# Patient Record
Sex: Female | Born: 1990 | Race: Black or African American | Hispanic: No | Marital: Single | Smoking: Never smoker
Health system: Southern US, Community
[De-identification: ages and names within clinical notes are randomized; demographics above are authoritative.]

## PROBLEM LIST (undated history)

## (undated) ENCOUNTER — Inpatient Hospital Stay (HOSPITAL_COMMUNITY): Payer: Self-pay

## (undated) DIAGNOSIS — D649 Anemia, unspecified: Secondary | ICD-10-CM

## (undated) DIAGNOSIS — E669 Obesity, unspecified: Secondary | ICD-10-CM

## (undated) DIAGNOSIS — R0602 Shortness of breath: Secondary | ICD-10-CM

## (undated) DIAGNOSIS — L309 Dermatitis, unspecified: Secondary | ICD-10-CM

## (undated) DIAGNOSIS — D696 Thrombocytopenia, unspecified: Secondary | ICD-10-CM

## (undated) DIAGNOSIS — O99119 Other diseases of the blood and blood-forming organs and certain disorders involving the immune mechanism complicating pregnancy, unspecified trimester: Secondary | ICD-10-CM

## (undated) DIAGNOSIS — J302 Other seasonal allergic rhinitis: Secondary | ICD-10-CM

## (undated) HISTORY — DX: Other seasonal allergic rhinitis: J30.2

## (undated) HISTORY — DX: Obesity, unspecified: E66.9

## (undated) HISTORY — DX: Dermatitis, unspecified: L30.9

## (undated) HISTORY — DX: Shortness of breath: R06.02

## (undated) HISTORY — PX: TONSILLECTOMY: SUR1361

## (undated) HISTORY — DX: Anemia, unspecified: D64.9

---

## 2013-05-25 LAB — OB RESULTS CONSOLE RUBELLA ANTIBODY, IGM: Rubella: IMMUNE

## 2013-05-25 LAB — OB RESULTS CONSOLE HIV ANTIBODY (ROUTINE TESTING): HIV: NONREACTIVE

## 2013-05-25 LAB — OB RESULTS CONSOLE HEPATITIS B SURFACE ANTIGEN: Hepatitis B Surface Ag: NEGATIVE

## 2013-05-25 LAB — OB RESULTS CONSOLE ANTIBODY SCREEN: Antibody Screen: NEGATIVE

## 2013-07-06 LAB — OB RESULTS CONSOLE RPR: RPR: NONREACTIVE

## 2013-07-08 ENCOUNTER — Encounter (HOSPITAL_COMMUNITY): Payer: Self-pay | Admitting: *Deleted

## 2013-07-08 ENCOUNTER — Other Ambulatory Visit: Payer: Self-pay | Admitting: Obstetrics and Gynecology

## 2013-07-08 ENCOUNTER — Inpatient Hospital Stay (HOSPITAL_COMMUNITY)
Admission: AD | Admit: 2013-07-08 | Discharge: 2013-07-09 | Disposition: A | Discharge status: Home / Self Care | Payer: BC Managed Care – PPO | Source: Ambulatory Visit | Attending: Obstetrics and Gynecology | Admitting: Obstetrics and Gynecology

## 2013-07-08 DIAGNOSIS — O2390 Unspecified genitourinary tract infection in pregnancy, unspecified trimester: Secondary | ICD-10-CM | POA: Insufficient documentation

## 2013-07-08 DIAGNOSIS — N390 Urinary tract infection, site not specified: Secondary | ICD-10-CM | POA: Insufficient documentation

## 2013-07-08 MED ORDER — GENTAMICIN SULFATE 40 MG/ML IJ SOLN
80.0000 mg | INTRAMUSCULAR | Status: DC
  Filled 2013-07-08: qty 2

## 2013-07-08 MED ORDER — GENTAMICIN SULFATE 40 MG/ML IJ SOLN
80.0000 mg | INTRAMUSCULAR | Status: DC
  Administered 2013-07-08: 80 mg via INTRAMUSCULAR
  Filled 2013-07-08: qty 2

## 2013-07-08 NOTE — MAU Note (Signed)
PT SAYS SHE WAS IN OFFICE ON WED-  HAD - URINE SMELLED BAD ,  DID LABS.  TOLD HER SHE CONTINUES TO GET UTI'S.   DENIES   ANY S/S OF UTI

## 2013-07-09 ENCOUNTER — Inpatient Hospital Stay (HOSPITAL_COMMUNITY)
Admission: AD | Admit: 2013-07-09 | Discharge: 2013-07-09 | Disposition: A | Discharge status: Home / Self Care | Payer: BC Managed Care – PPO | Source: Ambulatory Visit | Attending: Obstetrics and Gynecology | Admitting: Obstetrics and Gynecology

## 2013-07-09 DIAGNOSIS — O2390 Unspecified genitourinary tract infection in pregnancy, unspecified trimester: Secondary | ICD-10-CM | POA: Insufficient documentation

## 2013-07-09 DIAGNOSIS — N390 Urinary tract infection, site not specified: Secondary | ICD-10-CM | POA: Insufficient documentation

## 2013-07-09 MED ORDER — GENTAMICIN SULFATE 40 MG/ML IJ SOLN
80.0000 mg | Freq: Once | INTRAMUSCULAR | Status: AC
  Administered 2013-07-09: 80 mg via INTRAMUSCULAR
  Filled 2013-07-09: qty 2

## 2013-07-09 NOTE — MAU Note (Signed)
DENIES ANY ALLERGIC REACTION.

## 2013-07-09 NOTE — MAU Note (Signed)
Pt states here for antibiotic shot. Got first shot here last night and is supposed to get a total of 3 shots. Was told gets frequent UTIs and that's what she need the antibiotic for. Denies any complaints at this time.

## 2013-07-10 ENCOUNTER — Inpatient Hospital Stay (HOSPITAL_COMMUNITY)
Admission: AD | Admit: 2013-07-10 | Discharge: 2013-07-10 | Disposition: A | Discharge status: Home / Self Care | Payer: BC Managed Care – PPO | Source: Ambulatory Visit | Attending: Obstetrics and Gynecology | Admitting: Obstetrics and Gynecology

## 2013-07-10 DIAGNOSIS — N390 Urinary tract infection, site not specified: Secondary | ICD-10-CM | POA: Insufficient documentation

## 2013-07-10 MED ORDER — GENTAMICIN SULFATE 40 MG/ML IJ SOLN
80.0000 mg | Freq: Once | INTRAMUSCULAR | Status: AC
  Administered 2013-07-10: 80 mg via INTRAMUSCULAR
  Filled 2013-07-10: qty 2

## 2013-08-23 LAB — OB RESULTS CONSOLE GC/CHLAMYDIA
Chlamydia: NEGATIVE
Gonorrhea: NEGATIVE

## 2013-08-23 LAB — OB RESULTS CONSOLE ABO/RH: RH Type: POSITIVE

## 2013-08-23 LAB — OB RESULTS CONSOLE GBS: GBS: POSITIVE

## 2013-09-15 ENCOUNTER — Inpatient Hospital Stay (HOSPITAL_COMMUNITY): Payer: BC Managed Care – PPO | Admitting: Anesthesiology

## 2013-09-15 ENCOUNTER — Encounter (HOSPITAL_COMMUNITY): Payer: Self-pay | Admitting: *Deleted

## 2013-09-15 ENCOUNTER — Inpatient Hospital Stay (HOSPITAL_COMMUNITY)
Admission: AD | Admit: 2013-09-15 | Discharge: 2013-09-19 | DRG: 765 | Disposition: A | Discharge status: Home / Self Care | Payer: BC Managed Care – PPO | Source: Ambulatory Visit | Attending: Obstetrics & Gynecology | Admitting: Obstetrics & Gynecology

## 2013-09-15 ENCOUNTER — Encounter (HOSPITAL_COMMUNITY): Payer: BC Managed Care – PPO | Admitting: Anesthesiology

## 2013-09-15 DIAGNOSIS — D689 Coagulation defect, unspecified: Secondary | ICD-10-CM | POA: Diagnosis present

## 2013-09-15 DIAGNOSIS — O339 Maternal care for disproportion, unspecified: Secondary | ICD-10-CM | POA: Diagnosis present

## 2013-09-15 DIAGNOSIS — D696 Thrombocytopenia, unspecified: Secondary | ICD-10-CM

## 2013-09-15 DIAGNOSIS — D62 Acute posthemorrhagic anemia: Secondary | ICD-10-CM | POA: Diagnosis present

## 2013-09-15 DIAGNOSIS — O99892 Other specified diseases and conditions complicating childbirth: Secondary | ICD-10-CM | POA: Diagnosis present

## 2013-09-15 DIAGNOSIS — O9902 Anemia complicating childbirth: Secondary | ICD-10-CM | POA: Diagnosis present

## 2013-09-15 DIAGNOSIS — Z22330 Carrier of Group B streptococcus: Secondary | ICD-10-CM

## 2013-09-15 DIAGNOSIS — O3660X0 Maternal care for excessive fetal growth, unspecified trimester, not applicable or unspecified: Secondary | ICD-10-CM | POA: Diagnosis present

## 2013-09-15 DIAGNOSIS — O324XX0 Maternal care for high head at term, not applicable or unspecified: Secondary | ICD-10-CM | POA: Diagnosis present

## 2013-09-15 DIAGNOSIS — E669 Obesity, unspecified: Secondary | ICD-10-CM | POA: Diagnosis present

## 2013-09-15 HISTORY — DX: Other diseases of the blood and blood-forming organs and certain disorders involving the immune mechanism complicating pregnancy, unspecified trimester: O99.119

## 2013-09-15 HISTORY — DX: Thrombocytopenia, unspecified: D69.6

## 2013-09-15 LAB — CBC
HCT: 30.5 % — ABNORMAL LOW (ref 36.0–46.0)
Hemoglobin: 9.7 g/dL — ABNORMAL LOW (ref 12.0–15.0)
MCH: 23 pg — ABNORMAL LOW (ref 26.0–34.0)
MCHC: 31.8 g/dL (ref 30.0–36.0)
MCV: 72.4 fL — ABNORMAL LOW (ref 78.0–100.0)
Platelets: 147 10*3/uL — ABNORMAL LOW (ref 150–400)
RBC: 4.21 MIL/uL (ref 3.87–5.11)
RDW: 14.8 % (ref 11.5–15.5)
WBC: 8.5 10*3/uL (ref 4.0–10.5)

## 2013-09-15 LAB — ABO/RH: ABO/RH(D): O POS

## 2013-09-15 LAB — TYPE AND SCREEN
ABO/RH(D): O POS
Antibody Screen: NEGATIVE

## 2013-09-15 LAB — RPR: RPR Ser Ql: NONREACTIVE

## 2013-09-15 MED ORDER — LACTATED RINGERS IV SOLN
500.0000 mL | INTRAVENOUS | Status: DC | PRN
  Administered 2013-09-15: 500 mL via INTRAVENOUS

## 2013-09-15 MED ORDER — OXYTOCIN 40 UNITS IN LACTATED RINGERS INFUSION - SIMPLE MED: 62.5000 mL/h | INTRAVENOUS | Status: DC

## 2013-09-15 MED ORDER — PHENYLEPHRINE 40 MCG/ML (10ML) SYRINGE FOR IV PUSH (FOR BLOOD PRESSURE SUPPORT)
80.0000 ug | PREFILLED_SYRINGE | INTRAVENOUS | Status: DC | PRN
  Filled 2013-09-15: qty 10

## 2013-09-15 MED ORDER — OXYCODONE-ACETAMINOPHEN 5-325 MG PO TABS: 1.0000 | ORAL_TABLET | ORAL | Status: DC | PRN

## 2013-09-15 MED ORDER — LIDOCAINE HCL (PF) 1 % IJ SOLN
INTRAMUSCULAR | Status: DC | PRN
  Administered 2013-09-15 (×4): 4 mL

## 2013-09-15 MED ORDER — PENICILLIN G POTASSIUM 5000000 UNITS IJ SOLR
2.5000 10*6.[IU] | INTRAVENOUS | Status: DC
  Administered 2013-09-15 – 2013-09-16 (×3): 2.5 10*6.[IU] via INTRAVENOUS
  Filled 2013-09-15 (×8): qty 2.5

## 2013-09-15 MED ORDER — LACTATED RINGERS IV SOLN: 500.0000 mL | Freq: Once | INTRAVENOUS | Status: DC

## 2013-09-15 MED ORDER — PENICILLIN G POTASSIUM 5000000 UNITS IJ SOLR
5.0000 10*6.[IU] | Freq: Once | INTRAVENOUS | Status: AC
  Administered 2013-09-15: 5 10*6.[IU] via INTRAVENOUS
  Filled 2013-09-15: qty 5

## 2013-09-15 MED ORDER — EPHEDRINE 5 MG/ML INJ: 10.0000 mg | INTRAVENOUS | Status: DC | PRN

## 2013-09-15 MED ORDER — DIPHENHYDRAMINE HCL 50 MG/ML IJ SOLN: 12.5000 mg | INTRAMUSCULAR | Status: DC | PRN

## 2013-09-15 MED ORDER — FENTANYL 2.5 MCG/ML BUPIVACAINE 1/10 % EPIDURAL INFUSION (WH - ANES)
14.0000 mL/h | INTRAMUSCULAR | Status: DC | PRN
  Administered 2013-09-15 – 2013-09-16 (×2): 14 mL/h via EPIDURAL
  Filled 2013-09-15 (×2): qty 125

## 2013-09-15 MED ORDER — LIDOCAINE HCL (PF) 1 % IJ SOLN: 30.0000 mL | INTRAMUSCULAR | Status: DC | PRN

## 2013-09-15 MED ORDER — PHENYLEPHRINE 40 MCG/ML (10ML) SYRINGE FOR IV PUSH (FOR BLOOD PRESSURE SUPPORT)
80.0000 ug | PREFILLED_SYRINGE | INTRAVENOUS | Status: DC | PRN
  Administered 2013-09-15: 80 ug via INTRAVENOUS
  Filled 2013-09-15: qty 10

## 2013-09-15 MED ORDER — LACTATED RINGERS IV SOLN
INTRAVENOUS | Status: DC
  Administered 2013-09-15 – 2013-09-16 (×3): via INTRAVENOUS

## 2013-09-15 MED ORDER — EPHEDRINE 5 MG/ML INJ
10.0000 mg | INTRAVENOUS | Status: DC | PRN
  Filled 2013-09-15: qty 4

## 2013-09-15 MED ORDER — CITRIC ACID-SODIUM CITRATE 334-500 MG/5ML PO SOLN
30.0000 mL | ORAL | Status: DC | PRN
  Administered 2013-09-16 (×2): 30 mL via ORAL
  Filled 2013-09-15 (×2): qty 15

## 2013-09-15 MED ORDER — IBUPROFEN 600 MG PO TABS: 600.0000 mg | ORAL_TABLET | Freq: Four times a day (QID) | ORAL | Status: DC | PRN

## 2013-09-15 NOTE — Progress Notes (Signed)
Subjective:   Moderate discomfort with ctx. Declines pain meds/epidural. Objective:   VS: Blood pressure 119/76, pulse 91, temperature 98.1 F (36.7 C), temperature source Oral, resp. rate 18, height 5\' 1"  (1.549 m), weight 113.853 kg (251 lb). FHR : baseline 135 / variability moderate / accelerations present / absent decelerations Toco: contractions every 2-5 minutes  Cervix : 5/90/-2 Membranes: bulging  Assessment:  First stage labor, latent FHR category I GBS +  Plan:  First dose of PCN infusing. Ambulate/change positions for discomfort and aide labor progression. Expectant management.     Donette Larry, N MSN, CNM 09/15/2013, 9:29 PM

## 2013-09-15 NOTE — H&P (Signed)
  OB ADMISSION/ HISTORY & PHYSICAL:  Admission Date: 09/15/2013  5:57 PM  Admit Diagnosis: 38.[redacted] wks gestation  Megan Ball is a 22 y.o. female presenting for regular painful ctx, q 5 min over last 2 hrs. Cervical change from exam this am.  Prenatal History: G1P0   EDC : 09/24/2013, by Other Basis  Prenatal care at [facility]  Primary Ob Provider: Donette Larry, CNM Prenatal course complicated by IDA, excessive weight gain, +GBS, elevated AFI (22.1) and near LGA (EFW 8-6 [88%]  on 09/14/13) Prenatal Labs: ABO, Rh: O (10/14 0000) POS Antibody: Negative (07/16 0000) Rubella: Immune (07/16 0000)  RPR: Nonreactive (08/27 0000) HBsAg: Negative (07/16 0000)  HIV: Non-reactive (07/16 0000)  GBS: Positive (10/14 0000)  1 hr GTT : 130  Medical / Surgical History :  Past medical history:  Past Medical History  Diagnosis Date  . Medical history non-contributory   Obesity  Past surgical history:  Past Surgical History  Procedure Laterality Date  . No past surgeries    Tonsillectomy  Family History: History reviewed. No pertinent family history.   Social History:  does not have a smoking history on file. She has never used smokeless tobacco. She reports that she does not drink alcohol or use illicit drugs.  Allergies: Review of patient's allergies indicates no known allergies.   Current Medications at time of admission:  Prior to Admission medications   Medication Sig Start Date End Date Taking? Authorizing Provider  Prenatal Vit-Fe Fumarate-FA (PRENATAL MULTIVITAMIN) TABS tablet Take 1 tablet by mouth daily at 12 noon.   Yes Historical Provider, MD     Review of Systems: +ctx q 5 min +FM No LOF No VB No dizziness, SOB, or chest pain   Physical Exam:  VS: Blood pressure 124/80, pulse 86, temperature 98.5 F (36.9 C), resp. rate 16, height 5\' 1"  (1.549 m), weight 113.853 kg (251 lb).  General: alert and oriented x3, appears mildly uncomfortable  with ctx Heart: RRR Lungs: Clear lung fields Abdomen: Gravid, soft and non-tender, non-distended  Extremities: Trace edema BLE, Homan;s negative  Genitalia / VE: Dilation: 4 Effacement (%): 80 Station: -2 Exam by::  ([name],cnm) (Office exam)  FHR: baseline rate 140 / variability moderate / accelerations present / absent decelerations TOCO: 3-5, moderate  Assessment: 38.[redacted] weeks gestation First stage of labor, latent FHR category I GBS+  Plan:  Admit, GBS prophylaxis, declines pain meds/epidural at this time. Ambulate, encourage mobility use of gravity. Expectant management.  Dr Seymour Bars notified of admission / plan of care   Lawernce Pitts MSN, CNM 09/15/2013, 7:17 PM

## 2013-09-15 NOTE — Anesthesia Procedure Notes (Signed)
Epidural Patient location during procedure: OB Start time: 09/15/2013 10:59 PM  Staffing Performed by: anesthesiologist   Preanesthetic Checklist Completed: patient identified, site marked, surgical consent, pre-op evaluation, timeout performed, IV checked, risks and benefits discussed and monitors and equipment checked  Epidural Patient position: sitting Prep: site prepped and draped and DuraPrep Patient monitoring: continuous pulse ox and blood pressure Approach: midline Injection technique: LOR air  Needle:  Needle type: Tuohy  Needle gauge: 17 G Needle length: 9 cm and 9 Needle insertion depth: 8 cm Catheter type: closed end flexible Catheter size: 19 Gauge Catheter at skin depth: 13 cm Test dose: negative  Assessment Events: blood not aspirated, injection not painful, no injection resistance, negative IV test and no paresthesia  Additional Notes Discussed risk of headache, infection, bleeding, nerve injury and failed or incomplete block.  Patient voices understanding and wishes to proceed.  Epidural placed easily on first attempt.  No paresthesia.  Patient tolerated procedure well with no apparent complications.  Jasmine December, MDReason for block:procedure for pain

## 2013-09-15 NOTE — Anesthesia Preprocedure Evaluation (Signed)
Anesthesia Evaluation  Patient identified by MRN, date of birth, ID band Patient awake    Reviewed: Allergy & Precautions, H&P , NPO status , Patient's Chart, lab work & pertinent test results, reviewed documented beta blocker date and time   History of Anesthesia Complications Negative for: history of anesthetic complications  Airway Mallampati: II TM Distance: >3 FB Neck ROM: full    Dental  (+) Teeth Intact   Pulmonary neg pulmonary ROS,  breath sounds clear to auscultation        Cardiovascular negative cardio ROS  Rhythm:regular Rate:Normal     Neuro/Psych negative neurological ROS  negative psych ROS   GI/Hepatic negative GI ROS, Neg liver ROS,   Endo/Other  Morbid obesity  Renal/GU negative Renal ROS     Musculoskeletal   Abdominal   Peds  Hematology  (+) anemia ,   Anesthesia Other Findings   Reproductive/Obstetrics (+) Pregnancy                           Anesthesia Physical Anesthesia Plan  ASA: III  Anesthesia Plan: Epidural   Post-op Pain Management:    Induction:   Airway Management Planned:   Additional Equipment:   Intra-op Plan:   Post-operative Plan:   Informed Consent: I have reviewed the patients History and Physical, chart, labs and discussed the procedure including the risks, benefits and alternatives for the proposed anesthesia with the patient or authorized representative who has indicated his/her understanding and acceptance.     Plan Discussed with:   Anesthesia Plan Comments:         Anesthesia Quick Evaluation

## 2013-09-16 ENCOUNTER — Encounter (HOSPITAL_COMMUNITY)
Admission: AD | Disposition: A | Discharge status: Home / Self Care | Payer: Self-pay | Source: Ambulatory Visit | Attending: Obstetrics & Gynecology

## 2013-09-16 ENCOUNTER — Encounter (HOSPITAL_COMMUNITY): Payer: Self-pay | Admitting: Neonatology

## 2013-09-16 SURGERY — Surgical Case
Anesthesia: Epidural | Site: Abdomen | Wound class: Clean Contaminated

## 2013-09-16 MED ORDER — SIMETHICONE 80 MG PO CHEW
80.0000 mg | CHEWABLE_TABLET | Freq: Three times a day (TID) | ORAL | Status: DC
  Administered 2013-09-17 – 2013-09-19 (×7): 80 mg via ORAL
  Filled 2013-09-16 (×6): qty 1

## 2013-09-16 MED ORDER — SCOPOLAMINE 1 MG/3DAYS TD PT72
1.0000 | MEDICATED_PATCH | Freq: Once | TRANSDERMAL | Status: AC
  Administered 2013-09-16: 1.5 mg via TRANSDERMAL

## 2013-09-16 MED ORDER — KETOROLAC TROMETHAMINE 60 MG/2ML IM SOLN
INTRAMUSCULAR | Status: AC
  Administered 2013-09-16: 60 mg via INTRAMUSCULAR
  Filled 2013-09-16: qty 2

## 2013-09-16 MED ORDER — LACTATED RINGERS IV SOLN
INTRAVENOUS | Status: DC | PRN
  Administered 2013-09-16 (×2): via INTRAVENOUS

## 2013-09-16 MED ORDER — ONDANSETRON HCL 4 MG/2ML IJ SOLN: 4.0000 mg | Freq: Three times a day (TID) | INTRAMUSCULAR | Status: DC | PRN

## 2013-09-16 MED ORDER — PHENYLEPHRINE 40 MCG/ML (10ML) SYRINGE FOR IV PUSH (FOR BLOOD PRESSURE SUPPORT)
PREFILLED_SYRINGE | INTRAVENOUS | Status: AC
  Filled 2013-09-16: qty 5

## 2013-09-16 MED ORDER — FERROUS SULFATE 325 (65 FE) MG PO TABS
325.0000 mg | ORAL_TABLET | Freq: Two times a day (BID) | ORAL | Status: DC
  Administered 2013-09-17 – 2013-09-19 (×5): 325 mg via ORAL
  Filled 2013-09-16 (×5): qty 1

## 2013-09-16 MED ORDER — ZOLPIDEM TARTRATE 5 MG PO TABS: 5.0000 mg | ORAL_TABLET | Freq: Every evening | ORAL | Status: DC | PRN

## 2013-09-16 MED ORDER — NALOXONE HCL 1 MG/ML IJ SOLN
1.0000 ug/kg/h | INTRAVENOUS | Status: DC | PRN
  Filled 2013-09-16: qty 2

## 2013-09-16 MED ORDER — NALOXONE HCL 0.4 MG/ML IJ SOLN: 0.4000 mg | INTRAMUSCULAR | Status: DC | PRN

## 2013-09-16 MED ORDER — WITCH HAZEL-GLYCERIN EX PADS: 1.0000 "application " | MEDICATED_PAD | CUTANEOUS | Status: DC | PRN

## 2013-09-16 MED ORDER — CEFAZOLIN SODIUM 1-5 GM-% IV SOLN
INTRAVENOUS | Status: DC | PRN
  Administered 2013-09-16: 2 g via INTRAVENOUS

## 2013-09-16 MED ORDER — PHENYLEPHRINE HCL 10 MG/ML IJ SOLN
INTRAMUSCULAR | Status: AC
  Filled 2013-09-16: qty 1

## 2013-09-16 MED ORDER — DIPHENHYDRAMINE HCL 25 MG PO CAPS: 25.0000 mg | ORAL_CAPSULE | ORAL | Status: DC | PRN

## 2013-09-16 MED ORDER — BUPIVACAINE HCL (PF) 0.25 % IJ SOLN
INTRAMUSCULAR | Status: AC
  Filled 2013-09-16: qty 30

## 2013-09-16 MED ORDER — KETOROLAC TROMETHAMINE 30 MG/ML IJ SOLN: 30.0000 mg | Freq: Four times a day (QID) | INTRAMUSCULAR | Status: AC | PRN

## 2013-09-16 MED ORDER — NALBUPHINE HCL 10 MG/ML IJ SOLN
5.0000 mg | INTRAMUSCULAR | Status: DC | PRN
  Filled 2013-09-16: qty 1

## 2013-09-16 MED ORDER — OXYTOCIN 40 UNITS IN LACTATED RINGERS INFUSION - SIMPLE MED
1.0000 m[IU]/min | INTRAVENOUS | Status: DC
  Administered 2013-09-16: 2 m[IU]/min via INTRAVENOUS
  Filled 2013-09-16: qty 1000

## 2013-09-16 MED ORDER — NALBUPHINE HCL 10 MG/ML IJ SOLN
5.0000 mg | INTRAMUSCULAR | Status: DC | PRN
  Administered 2013-09-16: 10 mg via SUBCUTANEOUS
  Filled 2013-09-16 (×2): qty 1

## 2013-09-16 MED ORDER — SENNOSIDES-DOCUSATE SODIUM 8.6-50 MG PO TABS
2.0000 | ORAL_TABLET | ORAL | Status: DC
  Administered 2013-09-17 – 2013-09-19 (×3): 2 via ORAL
  Filled 2013-09-16 (×3): qty 2

## 2013-09-16 MED ORDER — MENTHOL 3 MG MT LOZG: 1.0000 | LOZENGE | OROMUCOSAL | Status: DC | PRN

## 2013-09-16 MED ORDER — MEPERIDINE HCL 25 MG/ML IJ SOLN: 6.2500 mg | INTRAMUSCULAR | Status: DC | PRN

## 2013-09-16 MED ORDER — TETANUS-DIPHTH-ACELL PERTUSSIS 5-2.5-18.5 LF-MCG/0.5 IM SUSP: 0.5000 mL | Freq: Once | INTRAMUSCULAR | Status: DC

## 2013-09-16 MED ORDER — MAGNESIUM HYDROXIDE 400 MG/5ML PO SUSP: 30.0000 mL | ORAL | Status: DC | PRN

## 2013-09-16 MED ORDER — ONDANSETRON HCL 4 MG/2ML IJ SOLN
INTRAMUSCULAR | Status: DC | PRN
  Administered 2013-09-16: 4 mg via INTRAVENOUS

## 2013-09-16 MED ORDER — SODIUM BICARBONATE 8.4 % IV SOLN
INTRAVENOUS | Status: AC
  Filled 2013-09-16: qty 50

## 2013-09-16 MED ORDER — SODIUM BICARBONATE 8.4 % IV SOLN
INTRAVENOUS | Status: DC | PRN
  Administered 2013-09-16 (×3): 5 mL via EPIDURAL

## 2013-09-16 MED ORDER — HYDROMORPHONE HCL PF 1 MG/ML IJ SOLN: 0.2500 mg | INTRAMUSCULAR | Status: DC | PRN

## 2013-09-16 MED ORDER — ONDANSETRON HCL 4 MG/2ML IJ SOLN: 4.0000 mg | Freq: Four times a day (QID) | INTRAMUSCULAR | Status: DC

## 2013-09-16 MED ORDER — BUPIVACAINE HCL (PF) 0.25 % IJ SOLN
INTRAMUSCULAR | Status: DC | PRN
  Administered 2013-09-16: 20 mL

## 2013-09-16 MED ORDER — LIDOCAINE-EPINEPHRINE (PF) 2 %-1:200000 IJ SOLN
INTRAMUSCULAR | Status: AC
  Filled 2013-09-16: qty 20

## 2013-09-16 MED ORDER — SIMETHICONE 80 MG PO CHEW
80.0000 mg | CHEWABLE_TABLET | ORAL | Status: DC
  Administered 2013-09-17 – 2013-09-19 (×2): 80 mg via ORAL
  Filled 2013-09-16 (×3): qty 1

## 2013-09-16 MED ORDER — MORPHINE SULFATE (PF) 0.5 MG/ML IJ SOLN
INTRAMUSCULAR | Status: DC | PRN
  Administered 2013-09-16: 4 mg via EPIDURAL

## 2013-09-16 MED ORDER — LANOLIN HYDROUS EX OINT: 1.0000 "application " | TOPICAL_OINTMENT | CUTANEOUS | Status: DC | PRN

## 2013-09-16 MED ORDER — ONDANSETRON HCL 4 MG PO TABS: 4.0000 mg | ORAL_TABLET | ORAL | Status: DC | PRN

## 2013-09-16 MED ORDER — METOCLOPRAMIDE HCL 5 MG/ML IJ SOLN: 10.0000 mg | Freq: Three times a day (TID) | INTRAMUSCULAR | Status: DC | PRN

## 2013-09-16 MED ORDER — OXYTOCIN 10 UNIT/ML IJ SOLN
40.0000 [IU] | INTRAVENOUS | Status: DC | PRN
  Administered 2013-09-16: 40 [IU] via INTRAVENOUS

## 2013-09-16 MED ORDER — OXYTOCIN 40 UNITS IN LACTATED RINGERS INFUSION - SIMPLE MED: 62.5000 mL/h | INTRAVENOUS | Status: AC

## 2013-09-16 MED ORDER — DIBUCAINE 1 % RE OINT: 1.0000 "application " | TOPICAL_OINTMENT | RECTAL | Status: DC | PRN

## 2013-09-16 MED ORDER — OXYTOCIN 10 UNIT/ML IJ SOLN
INTRAMUSCULAR | Status: AC
  Filled 2013-09-16: qty 4

## 2013-09-16 MED ORDER — SIMETHICONE 80 MG PO CHEW: 80.0000 mg | CHEWABLE_TABLET | ORAL | Status: DC | PRN

## 2013-09-16 MED ORDER — DIPHENHYDRAMINE HCL 50 MG/ML IJ SOLN: 12.5000 mg | INTRAMUSCULAR | Status: DC | PRN

## 2013-09-16 MED ORDER — TERBUTALINE SULFATE 1 MG/ML IJ SOLN: 0.2500 mg | Freq: Once | INTRAMUSCULAR | Status: DC | PRN

## 2013-09-16 MED ORDER — ERYTHROMYCIN 5 MG/GM OP OINT
TOPICAL_OINTMENT | OPHTHALMIC | Status: AC
  Filled 2013-09-16: qty 1

## 2013-09-16 MED ORDER — ONDANSETRON HCL 4 MG/2ML IJ SOLN: 4.0000 mg | INTRAMUSCULAR | Status: DC | PRN

## 2013-09-16 MED ORDER — FENTANYL CITRATE 0.05 MG/ML IJ SOLN
INTRAMUSCULAR | Status: DC | PRN
Start: 2013-09-16 — End: 2013-09-16
  Administered 2013-09-16: 50 ug via INTRATHECAL
  Administered 2013-09-16: 50 ug via INTRAVENOUS

## 2013-09-16 MED ORDER — IBUPROFEN 600 MG PO TABS
600.0000 mg | ORAL_TABLET | Freq: Four times a day (QID) | ORAL | Status: DC
  Administered 2013-09-17 (×2): 600 mg via ORAL
  Filled 2013-09-16 (×2): qty 1

## 2013-09-16 MED ORDER — FENTANYL CITRATE 0.05 MG/ML IJ SOLN
INTRAMUSCULAR | Status: AC
  Filled 2013-09-16: qty 2

## 2013-09-16 MED ORDER — MORPHINE SULFATE 0.5 MG/ML IJ SOLN
INTRAMUSCULAR | Status: AC
  Filled 2013-09-16: qty 10

## 2013-09-16 MED ORDER — SCOPOLAMINE 1 MG/3DAYS TD PT72
MEDICATED_PATCH | TRANSDERMAL | Status: AC
  Administered 2013-09-16: 1.5 mg via TRANSDERMAL
  Filled 2013-09-16: qty 1

## 2013-09-16 MED ORDER — OXYCODONE-ACETAMINOPHEN 5-325 MG PO TABS
1.0000 | ORAL_TABLET | ORAL | Status: DC | PRN
  Administered 2013-09-17 – 2013-09-19 (×8): 1 via ORAL
  Filled 2013-09-16 (×9): qty 1

## 2013-09-16 MED ORDER — LACTATED RINGERS IV SOLN
INTRAVENOUS | Status: DC
  Administered 2013-09-16: 20:00:00 via INTRAVENOUS

## 2013-09-16 MED ORDER — PRENATAL MULTIVITAMIN CH
1.0000 | ORAL_TABLET | Freq: Every day | ORAL | Status: DC
  Administered 2013-09-17 – 2013-09-18 (×2): 1 via ORAL
  Filled 2013-09-16 (×2): qty 1

## 2013-09-16 MED ORDER — DIPHENHYDRAMINE HCL 25 MG PO CAPS
25.0000 mg | ORAL_CAPSULE | Freq: Four times a day (QID) | ORAL | Status: DC | PRN
  Administered 2013-09-17: 25 mg via ORAL
  Filled 2013-09-16: qty 1

## 2013-09-16 MED ORDER — ONDANSETRON HCL 4 MG/2ML IJ SOLN
INTRAMUSCULAR | Status: AC
  Filled 2013-09-16: qty 2

## 2013-09-16 MED ORDER — SODIUM CHLORIDE 0.9 % IJ SOLN: 3.0000 mL | INTRAMUSCULAR | Status: DC | PRN

## 2013-09-16 MED ORDER — KETOROLAC TROMETHAMINE 60 MG/2ML IM SOLN
60.0000 mg | Freq: Once | INTRAMUSCULAR | Status: AC | PRN
  Administered 2013-09-16: 60 mg via INTRAMUSCULAR

## 2013-09-16 MED ORDER — DIPHENHYDRAMINE HCL 50 MG/ML IJ SOLN
25.0000 mg | INTRAMUSCULAR | Status: DC | PRN
  Filled 2013-09-16 (×2): qty 1

## 2013-09-16 SURGICAL SUPPLY — 31 items
CLAMP CORD UMBIL (MISCELLANEOUS) IMPLANT
CLOTH BEACON ORANGE TIMEOUT ST (SAFETY) ×2 IMPLANT
DRAPE LG THREE QUARTER DISP (DRAPES) ×4 IMPLANT
DRSG OPSITE POSTOP 4X10 (GAUZE/BANDAGES/DRESSINGS) ×2 IMPLANT
DURAPREP 26ML APPLICATOR (WOUND CARE) ×2 IMPLANT
ELECT REM PT RETURN 9FT ADLT (ELECTROSURGICAL) ×2
ELECTRODE REM PT RTRN 9FT ADLT (ELECTROSURGICAL) ×1 IMPLANT
EXTRACTOR VACUUM M CUP 4 TUBE (SUCTIONS) IMPLANT
GLOVE BIO SURGEON STRL SZ 6.5 (GLOVE) ×2 IMPLANT
GLOVE BIOGEL PI IND STRL 7.0 (GLOVE) ×1 IMPLANT
GLOVE BIOGEL PI INDICATOR 7.0 (GLOVE) ×1
GOWN PREVENTION PLUS XLARGE (GOWN DISPOSABLE) ×4 IMPLANT
GOWN STRL REIN XL XLG (GOWN DISPOSABLE) ×4 IMPLANT
NEEDLE HYPO 22GX1.5 SAFETY (NEEDLE) ×2 IMPLANT
PACK C SECTION WH (CUSTOM PROCEDURE TRAY) ×2 IMPLANT
PAD OB MATERNITY 4.3X12.25 (PERSONAL CARE ITEMS) ×2 IMPLANT
RETRACTOR WND ALEXIS 25 LRG (MISCELLANEOUS) ×1 IMPLANT
RTRCTR C-SECT PINK 25CM LRG (MISCELLANEOUS) IMPLANT
RTRCTR WOUND ALEXIS 25CM LRG (MISCELLANEOUS) ×2
STAPLER VISISTAT 35W (STAPLE) ×2 IMPLANT
SUT PLAIN 2 0 XLH (SUTURE) ×2 IMPLANT
SUT VIC AB 0 CT1 27 (SUTURE) ×2
SUT VIC AB 0 CT1 27XBRD ANBCTR (SUTURE) ×2 IMPLANT
SUT VIC AB 0 CTX 36 (SUTURE) ×2
SUT VIC AB 0 CTX36XBRD ANBCTRL (SUTURE) ×2 IMPLANT
SUT VIC AB 2-0 CT1 27 (SUTURE) ×1
SUT VIC AB 2-0 CT1 TAPERPNT 27 (SUTURE) ×1 IMPLANT
SYR CONTROL 10ML LL (SYRINGE) ×2 IMPLANT
TOWEL OR 17X24 6PK STRL BLUE (TOWEL DISPOSABLE) ×2 IMPLANT
TRAY FOLEY CATH 14FR (SET/KITS/TRAYS/PACK) ×2 IMPLANT
WATER STERILE IRR 1000ML POUR (IV SOLUTION) IMPLANT

## 2013-09-16 NOTE — Anesthesia Postprocedure Evaluation (Signed)
  Anesthesia Post-op Note  Patient: Megan Ball  Procedure(s) Performed: Procedure(s): CESAREAN SECTION (N/A)  Patient is awake, responsive, moving her legs, and has signs of resolution of her numbness. Pain and nausea are reasonably well controlled. Vital signs are stable and clinically acceptable. Oxygen saturation is clinically acceptable. There are no apparent anesthetic complications at this time. Patient is ready for discharge.

## 2013-09-16 NOTE — Progress Notes (Signed)
Patient ID: Megan Ball, female   DOB: May 01, 1991, 22 y.o.   MRN: [id number] S: Sleeping intermittently, pain well-controlled with an Epidural.     O: Filed Vitals:   09/16/13 0830 09/16/13 0836 09/16/13 0900 09/16/13 0943  BP: 107/48  106/52   Pulse: 88  92   Temp:      TempSrc:      Resp:  21 20 20   Height:      Weight:         FHT:  FHR: 140 bpm, variability: moderate,  accelerations:  Present,  decelerations:  Absent UC:   regular, every 1.5-3 minutes MVUs: 125-170 SVE:   Dilation: 7 Effacement (%): 90 Station: -2/-1 Exam by:: Carloyn Jaeger, CNM   A / P: Protracted active phase Failure to Descend Inadequate MVUs CPD  Fetal Wellbeing:  Category I Pain Control:  Epidural IUPC - MVUs: 125-170 (not on Pitocin)  Anticipated MOD:  Recommend Primary C/S - will consult with Dr. Seymour Bars Discussed how CPD can make it difficult for a vaginal birth and risks of shoulder dystocia  Consulted Dr. Seymour Bars concerning plan of care / recommendation to prep for cesarean delivery - Dr. Seymour Bars will talk with patient and family prior to surgery to discuss rationale and risks/benefits of a cesarean delivery   [name], M, MSN, CNM 09/16/2013, 9:50 AM

## 2013-09-16 NOTE — Progress Notes (Addendum)
Subjective:   Resting, comfortable with epidural.  Objective:   VS: Blood pressure 118/60, pulse 97, temperature 98.1 F (36.7 C), temperature source Oral, resp. rate 18, height 5\' 1"  (1.549 m), weight 113.853 kg (251 lb). FHR : baseline 140 / variability moderate / accelerations present / no decelerations Toco: contractions every 2-5 minutes  Cervix : 5/90/-2/ballotable Membranes: bulging  Assessment:  First stage labor, protracted FHR category I  Plan:  Start Pitocin augmentation. Fetal head ballotable, AROM when more applied to cervix or lower station.     Donette Larry, N MSN, CNM 09/16/2013, 12:13 AM

## 2013-09-16 NOTE — Progress Notes (Signed)
9604 turned patient to Semi Fowlers position. Adjusted Toco and Ultrasound, FHR decelertion began.  0600 Rolled patient to Left side .Pitocin off, O2 started @ 10 liters, scalp electrode placed, scalp stim without recovery.  0604 rolled patient to Right side, 0605 FHR recovery. Difficult to move patient due to obesity and epidural anesthesia, family assisted.

## 2013-09-16 NOTE — Progress Notes (Signed)
Subjective:   Comfortable with epidural.   Objective:   VS: Blood pressure 111/66, pulse 89, temperature 97.9 F (36.6 C), temperature source Oral, resp. rate 18, height 5\' 1"  (1.549 m), weight 113.853 kg (251 lb). FHR : baseline 145 / variability moderate / accelerations present / absent decelerations Toco: contractions every 2-4 minutes  Cervix : 7/100/-2 Membranes: ruptured, clear Notified of prolonged decel @ 0550 x 8 min with return to baseline after resuscitative measures. FSE applied. Pitocin off.  Assessment:  First stage labor, protracted FHR category I  Plan:  Minimal progress, no descent, potential CPD with high-normal EFW. Will place IUPC for labor adequacy and recheck in 2-3 hours. May need consult for CS. Dr. Seymour Bars updated with assessment and plan.     Megan Ball, N MSN, CNM 09/16/2013, 7:08 AM

## 2013-09-16 NOTE — Progress Notes (Signed)
Subjective:   Comfortable with epidural. Nausea resolved.  Objective:   VS: Blood pressure 122/67, pulse 91, temperature 97.9 F (36.6 C), temperature source Oral, resp. rate 18, height 5\' 1"  (1.549 m), weight 113.853 kg (251 lb). FHR : baseline 135 / variability moderate / accelerations present / no decelerations Toco: contractions every 2-4 minutes  Cervix : 7/90/-2; 6/90/-2 (after AROM) Membranes: AROM, clear Pitocin: 85mu/min  Assessment:  First stage labor-protracted FHR category I GBS+  Plan:  Continue Pitocin augmentation, continue GBS prophylaxis. Fetal head applied to cervix now. Slow progress, IUPC if not adequate cervical change with next check. Guarded for SVD d/t fetal size/pelvis.     Donette Larry, N MSN, CNM 09/16/2013, 4:05 AM

## 2013-09-16 NOTE — Transfer of Care (Signed)
Immediate Anesthesia Transfer of Care Note  Patient: Megan Ball  Procedure(s) Performed: Procedure(s): CESAREAN SECTION (N/A)  Patient Location: PACU  Anesthesia Type:Epidural  Level of Consciousness: awake and alert   Airway & Oxygen Therapy: Patient Spontanous Breathing  Post-op Assessment: Report given to PACU RN and Post -op Vital signs reviewed and stable  Post vital signs: Reviewed and stable  Complications: No apparent anesthesia complications

## 2013-09-16 NOTE — Anesthesia Postprocedure Evaluation (Signed)
  Anesthesia Post-op Note  Patient: Megan Ball  Procedure(s) Performed: Procedure(s): CESAREAN SECTION (N/A)  Patient Location: PACU and Mother/Baby  Anesthesia Type:Epidural  Level of Consciousness: awake, alert , oriented and patient cooperative  Airway and Oxygen Therapy: Patient Spontanous Breathing  Post-op Pain: none  Post-op Assessment: Post-op Vital signs reviewed, Patient's Cardiovascular Status Stable and Respiratory Function Stable  Post-op Vital Signs: Reviewed and stable  Complications: No apparent anesthesia complications

## 2013-09-16 NOTE — Op Note (Signed)
Preoperative diagnosis: Intrauterine pregnancy at 38.5 weeks                                            Arrest of dilation and descent  Post operative diagnosis: Same  Anesthesia: Epidural  Anesthesiologist: Dr. Cristela Blue  Procedure: Primary urgent low transverse cesarean section  Surgeon: Dr. Genia Del  Assistant: Denton Meek   Estimated blood loss: 800 cc  Procedure:  After being informed of the planned procedure and possible complications including bleeding, infection, injury to other organs, informed consent is obtained. The patient is taken to OR #9 and the epidural anesthesia level was increased without complication. She is placed in the dorsal decubitus position with the pelvis tilted to the left. She is then prepped and draped in a sterile fashion. A Foley catheter is in her bladder.  After assessing adequate level of anesthesia, we infiltrate the suprapubic area with 20 cc of Marcaine 0.25 and perform a Pfannenstiel incision which is brought down sharply to the fascia. The fascia is entered in a low transverse fashion. Linea alba is dissected. Peritoneum is entered in a midline fashion. An Alexis retractor is easily positioned. Visceral peritoneum is entered in a low transverse fashion allowing [name] to safely retract bladder by developing a bladder flap.  The myometrium is then entered in a low transverse fashion; first with knife and then extended bluntly. Amniotic fluid is clear. We assist the birth of a female  infant in occiput posterior high presentation. Mouth and nose are suctioned. The baby is delivered. The cord is clamped and sectioned. The baby is given to the neonatologist present in the room.  10 cc of blood is drawn from the umbilical vein.The placenta is allowed to deliver spontaneously. It is complete and the cord has 3 vessels. Uterine revision is negative.  We proceed with closure of the myometrium in 2 layers: First with a running locked suture of 0  Vicryl, then with a Lembert suture of 0 Vicryl imbricating the first one. Hemostasis is completed with cauterization on peritoneal edges.  Both paracolic gutters are cleaned. Both tubes and ovaries are assessed and normal. The pelvis is profusely irrigated with warm saline to confirm a satisfactory hemostasis.  Retractors and sponges are removed. Under fascia hemostasis is completed with cauterization.  The peritoneum is closed with a running Vicryl 2-0.  The fascia is then closed with 2 running sutures of 0 Vicryl meeting midline. The wound is irrigated with warm saline and hemostasis is completed with cauterization.  The adipose tissue is reapproximated with a running 2-0 Plain.  The skin is closed with staples.  A dressing is applied.  Instrument and sponge count is complete x2. Estimated blood loss is 800 cc.  The procedure is well tolerated by the patient who is taken to recovery room in a well and stable condition.  A female baby was born at 11:20 am and received an Apgar of 9  at 1 minute and 9 at 5 minutes. Weight was pending.    Specimen: Placenta sent to [facility] MD 11/7/201412:03 PM

## 2013-09-17 ENCOUNTER — Encounter (HOSPITAL_COMMUNITY): Payer: Self-pay | Admitting: Obstetrics and Gynecology

## 2013-09-17 DIAGNOSIS — O99119 Other diseases of the blood and blood-forming organs and certain disorders involving the immune mechanism complicating pregnancy, unspecified trimester: Secondary | ICD-10-CM

## 2013-09-17 DIAGNOSIS — D696 Thrombocytopenia, unspecified: Secondary | ICD-10-CM | POA: Diagnosis present

## 2013-09-17 HISTORY — DX: Thrombocytopenia, unspecified: O99.119

## 2013-09-17 HISTORY — DX: Thrombocytopenia, unspecified: D69.6

## 2013-09-17 LAB — CBC
HCT: 23 % — ABNORMAL LOW (ref 36.0–46.0)
Hemoglobin: 7.5 g/dL — ABNORMAL LOW (ref 12.0–15.0)
MCH: 23.5 pg — ABNORMAL LOW (ref 26.0–34.0)
MCHC: 32.6 g/dL (ref 30.0–36.0)
MCV: 72.1 fL — ABNORMAL LOW (ref 78.0–100.0)
Platelets: 129 10*3/uL — ABNORMAL LOW (ref 150–400)
RBC: 3.19 MIL/uL — ABNORMAL LOW (ref 3.87–5.11)
RDW: 14.5 % (ref 11.5–15.5)
WBC: 12.1 10*3/uL — ABNORMAL HIGH (ref 4.0–10.5)

## 2013-09-17 NOTE — Lactation Note (Signed)
This note was copied from the chart of Boy Brandice Busser. Lactation Consultation Note Follow up requested by patient for assistance setting up her personal DEBP.  Baby latched on left breast.  Mom reports great latch and baby suckled for only 3 minutes.  Mom reports previously hearing swallows,  Baby too sleepy to latch.  Encouraged mom to have baby at breast and use hand pump and shells as needed to help evert nipples.  Not currently using hospital DEBP so I explained set up and cleaning of personal pump.    Patient Name: Boy Izola Teague [id number] Date: 09/17/2013 Reason for consult: Follow-up assessment   Maternal Data    Feeding Feeding Type: Breast Fed Length of feed: 3 min  LATCH Score/Interventions Latch: Grasps breast easily, tongue down, lips flanged, rhythmical sucking. Intervention(s): Breast compression;Breast massage  Audible Swallowing: A few with stimulation  Type of Nipple: Flat Intervention(s): Shells  Comfort (Breast/Nipple): Soft / non-tender     Hold (Positioning): No assistance needed to correctly position infant at breast.  LATCH Score: 8  Lactation Tools Discussed/Used     Consult Status Consult Status: Follow-up Date: 09/17/13 Follow-up type: In-patient    Jannifer Rodney 09/17/2013, 6:01 PM

## 2013-09-17 NOTE — Progress Notes (Signed)
Patient ID: Megan Ball, female   DOB: [birth year], 22 y.o.   MRN: [id number] Subjective: POD# 1 Information for the patient's newborn:  Megan, Ball [[id number]]  female  / circ planning outpatient  Reports feeling sore, but well Feeding: breast - well Patient reports tolerating PO.  Breast symptoms: flat nipples, using nipple shields with great success Pain controlled with narcotic analgesics including Percocet Denies HA/SOB/C/P/N/V/dizziness. Flatus absent. She reports vaginal bleeding as normal, without clots.  She is ambulating, urinating without difficult.     Objective:   VS:  Filed Vitals:   09/17/13 0011 09/17/13 0012 09/17/13 0440 09/17/13 0900  BP: 118/69 132/78 112/62 108/69  Pulse: 107 133 84 74  Temp:   97.5 F (36.4 C) 98 F (36.7 C)  TempSrc:   Oral Oral  Resp: 18 20 20 18   Height:      Weight:      SpO2: 98%  98% 97%     Intake/Output Summary (Last 24 hours) at 09/17/13 1144 Last data filed at 09/17/13 0404  Gross per 24 hour  Intake 1050.42 ml  Output   2050 ml  Net -999.58 ml        Recent Labs  09/15/13 1839 09/17/13 0640  WBC 8.5 12.1*  HGB 9.7* 7.5*  HCT 30.5* 23.0*  PLT 147* 129*     Blood type: --/--/O POS, O POS (11/06 1839)  Rubella: Immune (07/16 0000)     Physical Exam:  General: alert, cooperative, no distress and moderately obese CV: Regular rate and rhythm, S1S2 present or without murmur or extra heart sounds Resp: clear Abdomen: soft, nontender, normal bowel sounds Incision: Tegaderm and Honeycomb intact - skin approximated with staples  Uterine Fundus: firm, @ umbilicus, nontender Lochia: minimal Ext: edema 2+ and Homans sign is negative, no sign of DVT      Assessment/Plan: 22 y.o.   POD# 1.  s/p Cesarean Delivery.  Indications: failure to descend                Principal Problem:   Postpartum care following cesarean delivery (11/7) Active Problems:   Gestational thrombocytopenia IDA with compounding ABL  anemia  Doing well, stable.               Iron-rich regular diet as tolerated Ambulate Routine post-op care  Kenard Gower, MSN, CNM 09/17/2013, 11:44 AM

## 2013-09-19 ENCOUNTER — Encounter (HOSPITAL_COMMUNITY): Payer: Self-pay | Admitting: Obstetrics & Gynecology

## 2013-09-19 MED ORDER — SENNOSIDES-DOCUSATE SODIUM 8.6-50 MG PO TABS: 2.0000 | ORAL_TABLET | Freq: Two times a day (BID) | ORAL | Status: DC | PRN

## 2013-09-19 MED ORDER — FERROUS SULFATE 325 (65 FE) MG PO TABS: 325.0000 mg | ORAL_TABLET | Freq: Two times a day (BID) | ORAL | Status: DC

## 2013-09-19 MED ORDER — OXYCODONE-ACETAMINOPHEN 5-325 MG PO TABS: 1.0000 | ORAL_TABLET | ORAL | Status: DC | PRN

## 2013-09-19 NOTE — Progress Notes (Signed)
Patient ID: Megan Ball, female   DOB: 07/12/1991, 22 y.o.   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 Uterine Fundus: firm, below umbilicus, nontender Lochia: minimal Ext: edema +1 pedal and Homans sign is negative, no sign of DVT    A/P: POD # 3/ G2P1001/ S/P Primary C/Section d/t fetal distress Gestational thrombocytopenia; no ibuprofen Chronic anemia, worsened by ABL anemia Doing well and stable for discharge home Will remove staples and tegaderma prior to d/c home RX's: Percocet 5/325 1 - 2 tabs po every 6 hrs prn pain  #30 No refill Niferex 150mg  po QD/BID #30/#60 Refill x 1 Colace 100mg  po up to 3 x daily prn Rt pp visit in 6 weeks   Signed: Demetrius Revel, MSN, [facility] 09/19/2013, 9:01 AM

## 2013-09-21 NOTE — Discharge Summary (Signed)
Obstetric Discharge Summary Reason for Admission: G1 P0 @ 38w 5d in active labor; GBS +  Prenatal Procedures: NST and ultrasound Intrapartum Procedures: cesarean: low cervical, transverse Postpartum Procedures: none Complications-Operative and Postpartum: none Hemoglobin  Date Value Range Status  09/17/2013 7.5* 12.0 - 15.0 g/dL Final     DELTA CHECK NOTED     REPEATED TO VERIFY     HCT  Date Value Range Status  09/17/2013 23.0* 36.0 - 46.0 % Final    Physical Exam:  General: alert, cooperative and no distress Lochia: appropriate Uterine Fundus: firm Incision: closed with staples, well approximated.  Staples removed at d/c and benzoin/steri strips applied DVT Evaluation: No evidence of DVT seen on physical exam.  Discharge Diagnoses: G1 P1 s/p primary C/S for arrest of descent     Gest thrombocytopenia, but markedly improved at d/c to home; PLT 129  Discharge Information: Date: 09/21/2013 Activity: pelvic rest Diet: routine Medications: PNV, Colace, Iron and Percocet Condition: stable Instructions: refer to practice specific booklet Discharge to: home                   Follow up with BHAMBRI, MELANIE, N, CNM.   Specialty:  Obstetrics and Gynecology   Contact information:   [address] [place] [address] [phone]       Newborn Data: Live born female on 09/16/13 Birth Weight: 8 lb 14 oz (4026 g) APGAR: 9, 9  Home with mother. 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  Demetrius Revel, MSN, [facility] 09/19/13 12:00

## 2013-10-13 ENCOUNTER — Ambulatory Visit: Payer: BC Managed Care – PPO | Admitting: Family Medicine

## 2013-10-17 ENCOUNTER — Ambulatory Visit: Payer: BC Managed Care – PPO | Admitting: Family Medicine

## 2013-12-02 ENCOUNTER — Ambulatory Visit (INDEPENDENT_AMBULATORY_CARE_PROVIDER_SITE_OTHER): Payer: BC Managed Care – PPO | Admitting: Family Medicine

## 2013-12-02 ENCOUNTER — Encounter: Payer: Self-pay | Admitting: Family Medicine

## 2013-12-02 VITALS — BP 109/73 | HR 72 | Resp 16 | Ht 60.5 in | Wt 222.0 lb

## 2013-12-02 DIAGNOSIS — L259 Unspecified contact dermatitis, unspecified cause: Secondary | ICD-10-CM

## 2013-12-02 DIAGNOSIS — M549 Dorsalgia, unspecified: Secondary | ICD-10-CM

## 2013-12-02 DIAGNOSIS — L309 Dermatitis, unspecified: Secondary | ICD-10-CM

## 2013-12-02 DIAGNOSIS — D649 Anemia, unspecified: Secondary | ICD-10-CM

## 2013-12-02 LAB — CBC WITH DIFFERENTIAL/PLATELET
Basophils Absolute: 0 10*3/uL (ref 0.0–0.1)
Basophils Relative: 0 % (ref 0–1)
Eosinophils Absolute: 0.2 10*3/uL (ref 0.0–0.7)
Eosinophils Relative: 3 % (ref 0–5)
HCT: 33.9 % — ABNORMAL LOW (ref 36.0–46.0)
Hemoglobin: 10.2 g/dL — ABNORMAL LOW (ref 12.0–15.0)
Lymphocytes Relative: 34 % (ref 12–46)
Lymphs Abs: 2.2 10*3/uL (ref 0.7–4.0)
MCH: 22.1 pg — ABNORMAL LOW (ref 26.0–34.0)
MCHC: 30.1 g/dL (ref 30.0–36.0)
MCV: 73.5 fL — ABNORMAL LOW (ref 78.0–100.0)
Monocytes Absolute: 0.5 10*3/uL (ref 0.1–1.0)
Monocytes Relative: 8 % (ref 3–12)
Neutro Abs: 3.5 10*3/uL (ref 1.7–7.7)
Neutrophils Relative %: 55 % (ref 43–77)
Platelets: 288 10*3/uL (ref 150–400)
RBC: 4.61 MIL/uL (ref 3.87–5.11)
RDW: 15 % (ref 11.5–15.5)
WBC: 6.3 10*3/uL (ref 4.0–10.5)

## 2013-12-02 MED ORDER — BETAMETHASONE DIPROPIONATE 0.05 % EX OINT: TOPICAL_OINTMENT | Freq: Two times a day (BID) | CUTANEOUS | Status: AC

## 2013-12-02 NOTE — Patient Instructions (Signed)
1)  Back Pain - Continue with your diet and exercise to help with weight loss.  Try using My Fitness Pal to help you keep track of your calories in and how much you burn with exercise.  You may want to try a chiropractic adjustment.    2)  Eczema - Apply the steroid ointment as needed.    3)  Breast Reduction - Schedule an appointment with the plastic surgeon.     Back Injury Prevention The following tips can help you to prevent a back injury. PHYSICAL FITNESS  Exercise often. Try to develop strong stomach (abdominal) muscles.  Do aerobic exercises often. This includes walking, jogging, biking, swimming.  Do exercises that help with balance and strength often. This includes tai chi and yoga.  Stretch before and after you exercise.  Keep a healthy weight. DIET   Ask your doctor how much calcium and vitamin D you need every day.  Include calcium in your diet. Foods high in calcium include dairy products; green, leafy vegetables; and products with calcium added (fortified).  Include vitamin D in your diet. Foods high in vitamin D include milk and products with vitamin D added.  Think about taking a multivitamin or other nutritional products called " supplements."  Stop smoking if you smoke. POSTURE   Sit and stand up straight. Avoid leaning forward or hunching over.  Choose chairs that support your lower back.  If you work at a desk:  Sit close to your work so you do not lean over.  Keep your chin tucked in.  Keep your neck drawn back.  Keep your elbows bent at a right angle. Your arms should look like the letter "L."  Sit high and close to the steering wheel when you drive. Add low back support to your car seat if needed.  Avoid sitting or standing in one position for too long. Get up and move around every hour. Take breaks if you are driving for a long time.  Sleep on your side with your knees slightly bent. You can also sleep on your back with a pillow under your  knees. Do not sleep on your stomach. LIFTING, TWISTING, AND REACHING  Avoid heavy lifting, especially lifting over and over again. If you must do heavy lifting:  Stretch before lifting.  Work slowly.  Rest between lifts.  Use carts and dollies to move objects when possible.  Make several small trips instead of carrying 1 heavy load.  Ask for help when you need it.  Ask for help when moving big, awkward objects.  Follow these steps when lifting:  Stand with your feet shoulder-width apart.  Get as close to the object as you can. Do not pick up heavy objects that are far from your body.  Use handles or lifting straps when possible.  Bend at your knees. Squat down, but keep your heels off the floor.  Keep your shoulders back, your chin tucked in, and your back straight.  Lift the object slowly. Tighten the muscles in your legs, stomach, and butt. Keep the object as close to the center of your body as possible.  Reverse these directions when you put a load down.  Do not:  Lift the object above your waist.  Twist at the waist while lifting or carrying a load. Move your feet if you need to turn, not your waist.  Bend over without bending at your knees.  Avoid reaching over your head, across a table, or for an object  on a high surface. OTHER TIPS  Avoid wet floors and keep sidewalks clear of ice.  Do not sleep on a mattress that is too soft or too hard.  Keep items that you use often within easy reach.  Put heavier objects on shelves at waist level. Put lighter objects on lower or higher shelves.  Find ways to lessen your stress. You can try exercise, massage, or relaxation.  Get help for depression or anxiety if needed. GET HELP IF:  You injure your back.  You have questions about diet, exercise, or other ways to prevent back injuries. MAKE SURE YOU:  Understand these instructions.  Will watch your condition.  Will get help right away if you are not doing  well or get worse. Document Released: 04/14/2008 Document Revised: 01/19/2012 Document Reviewed: 12/08/2011 [facility] Patient Information 2014 [place].

## 2013-12-02 NOTE — Progress Notes (Signed)
Subjective:    HPI  Megan Ball is here today to discuss a couple of issues:     1)  Back/Neck Pain:  She says that she has struggled with this problem for years and it seems to be worse since she had her son.  She has upper back/neck pain that she feels is due to her large breasts and she has low back pain that she feels is from carrying her son.  She takes Ibuprofen or Tylenol for her pain.  She feels that she needs breast reduction surgery.  She is currently wearing a size 42 DDD bra but says that this may not even be her correct size.  She was recently measured by a lactation consultant who told her that she is actually a 4242 G or H.    2)  Eczema:   She continues to have dry patches of skin consistent with eczema.  She has used betamethasone cream in the past which has helped her dry patches and she would like a refill.     3)  Anemia:  She was anemic after she had her son.  She is still taking iron.     Review of Systems  Musculoskeletal: Positive for back pain.       Upper back  Skin: Positive for rash.       elbows and lower legs     Past Medical History  Diagnosis Date  . Postpartum care following cesarean delivery (11/7) 09/16/2013  . Gestational thrombocytopenia 09/17/2013  . Obesity      Past Surgical History  Procedure Laterality Date  . Cesarean section N/A 09/16/2013    Procedure: CESAREAN SECTION;  Surgeon: Genia DelMarie-Lyne Lavoie, MD;  Location: [facility];  Service: Obstetrics;  Laterality: N/A;     History   Social History Narrative   Marital Status: Single   Children:  Son Biomedical scientist(Jasiah)     Pets:  None    Living Situation: Lives with mother, sister, son.   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  She is taking this semester off.  She is studying business.     Tobacco Use/Exposure:  None    Alcohol Use:  None    Drug Use:  None   Diet:  Regular   Exercise:  Sit Ups   Hobbies: Shopping                  Family History  Problem Relation Age of Onset  . Obesity Mother   . Hypertension Maternal Grandmother   . Cancer Maternal Grandfather     Throat Cancer     Current Outpatient Prescriptions on File Prior to Visit  Medication Sig Dispense Refill  . Prenatal Vit-Fe Fumarate-FA (PRENATAL MULTIVITAMIN) TABS tablet Take 1 tablet by mouth daily at 12 noon.      . ferrous sulfate 325 (65 FE) MG tablet Take 1 tablet (325 mg total) by mouth 2 (two) times daily with a meal.  60 tablet  2   No current facility-administered medications on file prior to visit.     No Known Allergies   Immunization History  Administered Date(s) Administered  . Influenza Split 08/02/2013  . Tdap 07/06/2013       Objective:   Physical Exam  Vitals reviewed. Constitutional: She is oriented to person, place, and time.  Eyes: Conjunctivae are normal. No scleral icterus.  Neck: Neck supple. No thyromegaly present.  Cardiovascular: Normal rate,  regular rhythm and normal heart sounds.   Pulmonary/Chest: Effort normal and breath sounds normal.  Musculoskeletal: She exhibits tenderness (Back Pain ). She exhibits no edema.       Arms: Lymphadenopathy:    She has no cervical adenopathy.  Neurological: She is alert and oriented to person, place, and time.  Skin: Skin is warm and dry.  Psychiatric: She has a normal mood and affect. Her behavior is normal. Judgment and thought content normal.       Assessment & Plan:    Sarrah was seen today for eczema and back pain.  Diagnoses and associated orders for this visit:  Eczema - betamethasone dipropionate (DIPROLENE) 0.05 % ointment; Apply topically 2 (two) times daily.  Anemia Comments: We're checking a CBC to see that her hemoglobin has come up along with her platelets.   - CBC w/Diff  Backache Comments: She was given contact information for Dr. Chales Abrahams Contogiannis on [address] in [place] to discuss breast reduction surgery.    Mashawn  wants to breastfeed her son until he is one so she is not interested in surgery until next year.  She might consider seeing a chiropractor for an adjustment. She was given a handout with tips to avoid back injury.       TIME SPENT "FACE TO FACE" WITH PATIENT -  30 MINS

## 2013-12-04 DIAGNOSIS — D649 Anemia, unspecified: Secondary | ICD-10-CM | POA: Insufficient documentation

## 2013-12-04 DIAGNOSIS — L309 Dermatitis, unspecified: Secondary | ICD-10-CM | POA: Insufficient documentation

## 2013-12-04 DIAGNOSIS — M549 Dorsalgia, unspecified: Secondary | ICD-10-CM | POA: Insufficient documentation

## 2013-12-05 ENCOUNTER — Telehealth: Payer: Self-pay | Admitting: *Deleted

## 2013-12-05 NOTE — Telephone Encounter (Signed)
Message copied by Ancil BoozerGIL, Travaughn Vue D on Mon Dec 05, 2013  1:30 PM ------      Message from: Birdena JubileeZANARD, ROBYN      Created: Sun Dec 04, 2013  7:49 AM       Please let her know that her hemoglobin is almost back to normal and her platelets are back to normal.  She probably will want to stay on at least one iron pill per day.   ------

## 2013-12-05 NOTE — Telephone Encounter (Signed)
She is aware of her results and will continue taking her iron. PG

## 2014-09-11 ENCOUNTER — Encounter: Payer: Self-pay | Admitting: Family Medicine

## 2016-02-21 DIAGNOSIS — L309 Dermatitis, unspecified: Secondary | ICD-10-CM | POA: Diagnosis not present

## 2016-02-21 DIAGNOSIS — R5383 Other fatigue: Secondary | ICD-10-CM | POA: Diagnosis not present

## 2016-02-21 DIAGNOSIS — E559 Vitamin D deficiency, unspecified: Secondary | ICD-10-CM | POA: Diagnosis not present

## 2016-02-21 DIAGNOSIS — J309 Allergic rhinitis, unspecified: Secondary | ICD-10-CM | POA: Diagnosis not present

## 2016-03-13 DIAGNOSIS — E559 Vitamin D deficiency, unspecified: Secondary | ICD-10-CM | POA: Diagnosis not present

## 2016-03-13 DIAGNOSIS — R5383 Other fatigue: Secondary | ICD-10-CM | POA: Diagnosis not present

## 2016-03-13 DIAGNOSIS — E785 Hyperlipidemia, unspecified: Secondary | ICD-10-CM | POA: Diagnosis not present

## 2016-03-20 DIAGNOSIS — Z0000 Encounter for general adult medical examination without abnormal findings: Secondary | ICD-10-CM | POA: Diagnosis not present

## 2016-03-20 DIAGNOSIS — E559 Vitamin D deficiency, unspecified: Secondary | ICD-10-CM | POA: Diagnosis not present

## 2016-03-20 DIAGNOSIS — D509 Iron deficiency anemia, unspecified: Secondary | ICD-10-CM | POA: Diagnosis not present

## 2016-03-20 DIAGNOSIS — Z6841 Body Mass Index (BMI) 40.0 and over, adult: Secondary | ICD-10-CM | POA: Diagnosis not present

## 2016-05-21 DIAGNOSIS — Z30431 Encounter for routine checking of intrauterine contraceptive device: Secondary | ICD-10-CM | POA: Diagnosis not present

## 2016-05-21 DIAGNOSIS — R109 Unspecified abdominal pain: Secondary | ICD-10-CM | POA: Diagnosis not present

## 2016-05-21 DIAGNOSIS — R1084 Generalized abdominal pain: Secondary | ICD-10-CM | POA: Diagnosis not present

## 2016-07-24 DIAGNOSIS — R5383 Other fatigue: Secondary | ICD-10-CM | POA: Diagnosis not present

## 2016-08-08 DIAGNOSIS — Z6841 Body Mass Index (BMI) 40.0 and over, adult: Secondary | ICD-10-CM | POA: Diagnosis not present

## 2016-08-08 DIAGNOSIS — Z23 Encounter for immunization: Secondary | ICD-10-CM | POA: Diagnosis not present

## 2016-08-08 DIAGNOSIS — E559 Vitamin D deficiency, unspecified: Secondary | ICD-10-CM | POA: Diagnosis not present

## 2016-08-08 DIAGNOSIS — D509 Iron deficiency anemia, unspecified: Secondary | ICD-10-CM | POA: Diagnosis not present

## 2016-12-08 DIAGNOSIS — N760 Acute vaginitis: Secondary | ICD-10-CM | POA: Diagnosis not present

## 2016-12-08 DIAGNOSIS — Z113 Encounter for screening for infections with a predominantly sexual mode of transmission: Secondary | ICD-10-CM | POA: Diagnosis not present

## 2016-12-26 DIAGNOSIS — N760 Acute vaginitis: Secondary | ICD-10-CM | POA: Diagnosis not present

## 2016-12-26 DIAGNOSIS — R39198 Other difficulties with micturition: Secondary | ICD-10-CM | POA: Diagnosis not present

## 2017-02-06 ENCOUNTER — Encounter (HOSPITAL_BASED_OUTPATIENT_CLINIC_OR_DEPARTMENT_OTHER): Payer: Self-pay

## 2017-02-06 ENCOUNTER — Emergency Department (HOSPITAL_BASED_OUTPATIENT_CLINIC_OR_DEPARTMENT_OTHER)
Admission: EM | Admit: 2017-02-06 | Discharge: 2017-02-07 | Disposition: A | Discharge status: Home / Self Care | Payer: 59 | Attending: Emergency Medicine | Admitting: Emergency Medicine

## 2017-02-06 DIAGNOSIS — R112 Nausea with vomiting, unspecified: Secondary | ICD-10-CM | POA: Diagnosis not present

## 2017-02-06 DIAGNOSIS — R509 Fever, unspecified: Secondary | ICD-10-CM | POA: Diagnosis not present

## 2017-02-06 DIAGNOSIS — R51 Headache: Secondary | ICD-10-CM | POA: Diagnosis present

## 2017-02-06 LAB — PREGNANCY, URINE: Preg Test, Ur: NEGATIVE

## 2017-02-06 MED ORDER — ACETAMINOPHEN 325 MG PO TABS
650.0000 mg | ORAL_TABLET | Freq: Once | ORAL | Status: AC
  Administered 2017-02-06: 650 mg via ORAL
  Filled 2017-02-06: qty 2

## 2017-02-06 MED ORDER — IBUPROFEN 400 MG PO TABS
600.0000 mg | ORAL_TABLET | Freq: Once | ORAL | Status: AC
  Administered 2017-02-06: 600 mg via ORAL
  Filled 2017-02-06: qty 1

## 2017-02-06 MED ORDER — ONDANSETRON 4 MG PO TBDP
4.0000 mg | ORAL_TABLET | Freq: Once | ORAL | Status: AC
  Administered 2017-02-06: 4 mg via ORAL
  Filled 2017-02-06: qty 1

## 2017-02-06 NOTE — ED Provider Notes (Signed)
[facility] Provider Note   CSN: [id number] Arrival date & time: 02/06/17  2004   By signing my name below, I, Soijett Blue, attest that this documentation has been prepared under the direction and in the presence of Carmon Sails, PA-C Electronically Signed: Soijett Blue, ED Scribe. 02/06/17. 11:24 PM.  History   Chief Complaint Chief Complaint  Patient presents with  . Headache    HPI Megan Ball is a 26 y.o. female who presents to the Emergency Department complaining of headache, fever, nausea, vomiting, and intermittent abdominal pain right before vomiting. Pt has not tried any medications for the relief of her symptoms. She notes that she did obtain her flu vaccination this past year. Denies sick contacts with similar symptoms. Denies exposure to suspicious food.  No recent travel. No recent new medications. Denies nasal congestion, rhinorrhea, sore throat, diarrhea, body aches, appetite change, rash, dysuria, frequency, flank pain, and any other symptoms. Denies PMHx of asthma, DM, or seasonal allergies.    The history is provided by the patient and a relative. No language interpreter was used.    Past Medical History:  Diagnosis Date  . Gestational thrombocytopenia ([place]) 09/17/2013  . Obesity   . Postpartum care following cesarean delivery (11/7) 09/16/2013    Patient Active Problem List   Diagnosis Date Noted  . Backache 12/04/2013  . Anemia 12/04/2013  . Eczema 12/04/2013  . Gestational thrombocytopenia ([place]) 09/17/2013  . Postpartum care following cesarean delivery (11/7) 09/16/2013    Past Surgical History:  Procedure Laterality Date  . CESAREAN SECTION N/A 09/16/2013   Procedure: CESAREAN SECTION;  Surgeon: Princess Bruins, MD;  Location: [facility];  Service: Obstetrics;  Laterality: N/A;    OB History    Gravida Para Term Preterm AB Living   '2 1 1     1   ' SAB TAB Ectopic Multiple Live Births           1       Home Medications    Prior  to Admission medications   Medication Sig Start Date End Date Taking? Authorizing Provider  ondansetron (ZOFRAN ODT) 4 MG disintegrating tablet Take 1 tablet (4 mg total) by mouth every 8 (eight) hours as needed for nausea or vomiting. 02/07/17   Kinnie Feil, PA-C    Family History Family History  Problem Relation Age of Onset  . Obesity Mother   . Hypertension Maternal Grandmother   . Cancer Maternal Grandfather     Throat Cancer    Social History Social History  Substance Use Topics  . Smoking status: Never Smoker  . Smokeless tobacco: Never Used  . Alcohol use Yes     Comment: occ     Allergies   Patient has no known allergies.   Review of Systems Review of Systems  Constitutional: Positive for fever. Negative for appetite change.  HENT: Negative for congestion, rhinorrhea and sore throat.   Gastrointestinal: Positive for abdominal pain, nausea and vomiting. Negative for diarrhea.  Genitourinary: Negative for dysuria, flank pain and frequency.  Musculoskeletal: Negative for myalgias.  Skin: Negative for rash.  Neurological: Positive for headaches.     Physical Exam Updated Vital Signs BP 121/87 (BP Location: Right Arm)   Pulse 100   Temp 99 F (37.2 C) (Oral)   Resp 20   Ht 5' (1.524 m)   Wt 99.8 kg   SpO2 100%   BMI 42.97 kg/m   Physical Exam  Constitutional: She is oriented to person, place,  and time. She appears well-developed and well-nourished. No distress.  Pt felt warm to the touch.  HENT:  Head: Normocephalic and atraumatic.  Right Ear: External ear normal.  Left Ear: External ear normal.  Nose: Mucosal edema present.  Mouth/Throat: Oropharynx is clear and moist. No oropharyngeal exudate.  Mild mucosal edema with clear rhinorrhea bilaterally  Eyes: Conjunctivae and EOM are normal. Pupils are equal, round, and reactive to light. No scleral icterus.  Neck: Normal range of motion. Neck supple. No JVD present.  Negative  Brudzinski's Negative Kernig's  Cardiovascular: Normal rate, regular rhythm and normal heart sounds.   No murmur heard. Pulmonary/Chest: Effort normal and breath sounds normal. She has no wheezes.  Abdominal: Soft.  No surgical abdominal scars noted.  No pulsating masses.  + Bowel sounds throughout.  Abdomen is soft, non tender without distention, rigidity, guarding or rebound.  No suprapubic tenderness. No CVAT.  Negative Murphy's. Negative McBurney's. Negative Psoas sign.  Non palpable kidneys. No hepatosplenomegaly.   Musculoskeletal: Normal range of motion. She exhibits no deformity.  Lymphadenopathy:    She has no cervical adenopathy.  Neurological: She is alert and oriented to person, place, and time.  Pt is alert and oriented.   Speech and phonation normal.   Thought process coherent.   Strength 5/5 in upper and lower extremities.   Sensation to light touch intact in upper and lower extremities.  Gait normal.   Negative Romberg. No leg drift.  Intact finger to nose test. CN I not tested CN II full visual fields  CN III, IV, VI PEERL and EOM intact CN V light touch intact in all 3 divisions of trigeminal nerve CN VII facial nerve movements intact, symmetric CN VIII hearing intact to finger rub CN IX, X no uvula deviation, symmetric soft palate rise CN XI 5/5 SCM and trapezius strength  CN XII Tongue midline with symmetric L/R movement  Skin: Skin is warm and dry. Capillary refill takes less than 2 seconds.  Psychiatric: She has a normal mood and affect. Her behavior is normal. Judgment and thought content normal.  Nursing note and vitals reviewed.    ED Treatments / Results  DIAGNOSTIC STUDIES: Oxygen Saturation is 100% on RA, nl by my interpretation.    COORDINATION OF CARE: 11:23 PM Discussed treatment plan with pt at bedside which includes labs, UA, tylenol, zofran, and pt agreed to plan.   Labs (all labs ordered are listed, but only abnormal results are  displayed) Labs Reviewed  CBC - Abnormal; Notable for the following:       Result Value   MCV 73.1 (*)    MCH 24.3 (*)    All other components within normal limits  URINALYSIS, ROUTINE W REFLEX MICROSCOPIC - Abnormal; Notable for the following:    Ketones, ur >80 (*)    Protein, ur 30 (*)    Leukocytes, UA SMALL (*)    All other components within normal limits  URINALYSIS, MICROSCOPIC (REFLEX) - Abnormal; Notable for the following:    Bacteria, UA RARE (*)    Squamous Epithelial / LPF 0-5 (*)    All other components within normal limits  LIPASE, BLOOD  COMPREHENSIVE METABOLIC PANEL  PREGNANCY, URINE    EKG  EKG Interpretation None       Radiology No results found.  Procedures Procedures (including critical care time)  Medications Ordered in ED Medications  acetaminophen (TYLENOL) tablet 650 mg (650 mg Oral Given 02/06/17 2015)  ibuprofen (ADVIL,MOTRIN) tablet 600 mg (600  mg Oral Given 02/06/17 2346)  ondansetron (ZOFRAN-ODT) disintegrating tablet 4 mg (4 mg Oral Given 02/06/17 2346)     Initial Impression / Assessment and Plan / ED Course  I have reviewed the triage vital signs and the nursing notes.  Pertinent labs & imaging results that were available during my care of the patient were reviewed by me and considered in my medical decision making (see chart for details).  Clinical Course as of Feb 07 109  Sat Feb 07, 2017  0037 Sodium: 135 [CG]  0038 Potassium: 3.5 [CG]  0038 Creatinine: 0.60 [CG]  0038 AST: 24 [CG]  0038 Alkaline Phosphatase: 57 [CG]  0038 EGFR (African American): >60 [CG]  0038 Lipase: 15 [CG]  0038 WBC: 6.3 [CG]  0038 Hemoglobin: 12.1 [CG]  0038 Specific Gravity, Urine: 1.028 [CG]  0038 Nitrite: NEGATIVE [CG]  0038 Preg Test, Ur: NEGATIVE [CG]    Clinical Course User Index [CG] Kinnie Feil, PA-C   Vital signs are reassuring, low-grade fever at 100.6. No abdominal tenderness. Patient reported her headache has slightly improved  after Tylenol in the ED. Still complains of mild nausea, which improved after ODT Zofran. Patient was able to tolerate fluids and crackers in the ED without complications. High suspicion for viral etiology of fever, nausea, vomiting, headache. Considered meningitis, DKA, PUD, pregnancy and vertigo as less likely. No neurological deficits. No meningeal signs.  Emesis is NBNB and has only occurred twice in the last 2 days. No ne drugs, no new recent travel, no exposure to suspicious food. No history of head trauma. Abdomen is soft NTND. No heavy use of ETOH, NSAIDs, tobacco, no previous h/o PUD.  No diarrhea.  Urine pregnancy negative.  CBC without leukocytosis or anemia. CMP with normal electrolytes and LFTs. Lipase normal. U/A clean.  Given young age, no previous GI medical conditions, reassuring physical exam and lab work I do not think that emergent further imaging or lab work is indicated at this time. Successful PO challenge in the ED. Patient is considered safe for discharge at this time with Zofran as needed. Discussed lab findings with patient and family members at bedside, advised to monitor for resolution of symptoms in the next 3-5 days. Strict ED return precautions. Patient family members are aware of red flag symptoms that would warrant return to the emergency department for further workup. Patient left ED in good condition with all questions and concerns addressed.   Final Clinical Impressions(s) / ED Diagnoses   Final diagnoses:  Nausea and vomiting in adult patient  Fever in adult    New Prescriptions Discharge Medication List as of 02/07/2017 12:53 AM    START taking these medications   Details  ondansetron (ZOFRAN ODT) 4 MG disintegrating tablet Take 1 tablet (4 mg total) by mouth every 8 (eight) hours as needed for nausea or vomiting., Starting Sat 02/07/2017, Print       I personally performed the services described in this documentation, which was scribed in my presence. The  recorded information has been reviewed and is accurate.     Kinnie Feil, PA-C 02/07/17 [place], MD 02/10/17 [id number]

## 2017-02-06 NOTE — ED Triage Notes (Signed)
c/o HA x 2 days-vomiting, "feel hot" x today-NAD-fast paced steady gait

## 2017-02-06 NOTE — ED Notes (Addendum)
2-3 family members in room with 26  year old playing movies on ipad very loud and having gloves blown up playing and hitting each other with blown up gloves... Patient and family all laughing and playing. NAD.[name] All lights on in room . Pt states the noise and light do not bother her headache.

## 2017-02-07 LAB — COMPREHENSIVE METABOLIC PANEL
ALT: 16 U/L (ref 14–54)
AST: 24 U/L (ref 15–41)
Albumin: 4.2 g/dL (ref 3.5–5.0)
Alkaline Phosphatase: 57 U/L (ref 38–126)
Anion gap: 7 (ref 5–15)
BUN: 12 mg/dL (ref 6–20)
CO2: 26 mmol/L (ref 22–32)
Calcium: 9 mg/dL (ref 8.9–10.3)
Chloride: 102 mmol/L (ref 101–111)
Creatinine, Ser: 0.6 mg/dL (ref 0.44–1.00)
GFR calc Af Amer: >60 mL/min (ref >60)
GFR calc non Af Amer: >60 mL/min (ref >60)
Glucose, Bld: 93 mg/dL (ref 65–99)
Potassium: 3.5 mmol/L (ref 3.5–5.1)
Sodium: 135 mmol/L (ref 135–145)
Total Bilirubin: 1.1 mg/dL (ref 0.3–1.2)
Total Protein: 7.6 g/dL (ref 6.5–8.1)

## 2017-02-07 LAB — CBC
HCT: 36.4 % (ref 36.0–46.0)
Hemoglobin: 12.1 g/dL (ref 12.0–15.0)
MCH: 24.3 pg — ABNORMAL LOW (ref 26.0–34.0)
MCHC: 33.2 g/dL (ref 30.0–36.0)
MCV: 73.1 fL — ABNORMAL LOW (ref 78.0–100.0)
Platelets: 208 10*3/uL (ref 150–400)
RBC: 4.98 MIL/uL (ref 3.87–5.11)
RDW: 12.6 % (ref 11.5–15.5)
WBC: 6.3 10*3/uL (ref 4.0–10.5)

## 2017-02-07 LAB — URINALYSIS, ROUTINE W REFLEX MICROSCOPIC
Bilirubin Urine: NEGATIVE
Glucose, UA: NEGATIVE mg/dL
Hgb urine dipstick: NEGATIVE
Ketones, ur: >80 mg/dL — AB
Nitrite: NEGATIVE
Protein, ur: 30 mg/dL — AB
Specific Gravity, Urine: 1.028 (ref 1.005–1.030)
pH: 8 (ref 5.0–8.0)

## 2017-02-07 LAB — URINALYSIS, MICROSCOPIC (REFLEX): RBC / HPF: NONE SEEN RBC/hpf (ref 0–5)

## 2017-02-07 LAB — LIPASE, BLOOD: Lipase: 15 U/L (ref 11–51)

## 2017-02-07 MED ORDER — ONDANSETRON 4 MG PO TBDP: 4.0000 mg | ORAL_TABLET | Freq: Three times a day (TID) | ORAL | 0 refills | Status: DC | PRN

## 2017-02-07 NOTE — Discharge Instructions (Signed)
Your lab work is normal and reassuring today.  I suspect that the headache, fever, nausea, vomiting may be due to a virus possibly the flu.  Given reassuring lab work today we will treat your symptoms conservatively as I suspect that your body will be able to fight this infection.  Please take zofran as needed for nausea.  Make sure you drink plenty of fluids to avoid becoming dehydrated.  Taken ibuprofen or tylenol for headache and fever.  Avoid heavy, spicy, greasy foods to avoid exacerbating nausea and vomiting.    Monitor your symptoms, a viral infection usually resolves or starts to improve in 3-5 days.  Return to the emergency department if your symptoms worsen, you are unable to control nausea or vomiting.

## 2017-02-07 NOTE — ED Notes (Signed)
PT discharged to home NAD.  

## 2017-03-30 DIAGNOSIS — R8290 Unspecified abnormal findings in urine: Secondary | ICD-10-CM | POA: Diagnosis not present

## 2018-02-26 ENCOUNTER — Other Ambulatory Visit: Payer: Self-pay

## 2018-02-26 ENCOUNTER — Encounter (HOSPITAL_BASED_OUTPATIENT_CLINIC_OR_DEPARTMENT_OTHER): Payer: Self-pay

## 2018-02-26 DIAGNOSIS — J029 Acute pharyngitis, unspecified: Secondary | ICD-10-CM | POA: Diagnosis not present

## 2018-02-26 LAB — RAPID STREP SCREEN (MED CTR MEBANE ONLY): Streptococcus, Group A Screen (Direct): NEGATIVE

## 2018-02-26 NOTE — ED Triage Notes (Signed)
Pt c/o sore throat, runny nose, and itchy/puffy eyes. Pt NAD. Pt A+OX4.

## 2018-02-27 ENCOUNTER — Emergency Department (HOSPITAL_BASED_OUTPATIENT_CLINIC_OR_DEPARTMENT_OTHER)
Admission: EM | Admit: 2018-02-27 | Discharge: 2018-02-27 | Disposition: A | Discharge status: Home / Self Care | Payer: 59 | Attending: Emergency Medicine | Admitting: Emergency Medicine

## 2018-02-27 ENCOUNTER — Other Ambulatory Visit: Payer: Self-pay

## 2018-02-27 DIAGNOSIS — J029 Acute pharyngitis, unspecified: Secondary | ICD-10-CM

## 2018-02-27 NOTE — ED Provider Notes (Signed)
[facility] EMERGENCY DEPARTMENT Provider Note   CSN: [id number] Arrival date & time: 02/26/18  2257     History   Chief Complaint Chief Complaint  Patient presents with  . Sore Throat    HPI Megan Ball is a 27 y.o. female.  Patient presents with 2 days of sore throat, congestion and runny nose.  Denies fever.  She has pain with swallowing and pain with eating but no vomiting or diarrhea.  No one else has been sick.  Denies fever, chills, nausea or vomiting.  No chest pain or shortness of breath.  She does not take anything for her sore throat at home.  Denies any abdominal pain, chest pain, headache.  The history is provided by the patient.  Sore Throat  Pertinent negatives include no chest pain, no abdominal pain, no headaches and no shortness of breath.    Past Medical History:  Diagnosis Date  . Gestational thrombocytopenia (HCC) 09/17/2013  . Obesity   . Postpartum care following cesarean delivery (11/7) 09/16/2013    Patient Active Problem List   Diagnosis Date Noted  . Backache 12/04/2013  . Anemia 12/04/2013  . Eczema 12/04/2013  . Gestational thrombocytopenia (HCC) 09/17/2013  . Postpartum care following cesarean delivery (11/7) 09/16/2013    Past Surgical History:  Procedure Laterality Date  . CESAREAN SECTION N/A 09/16/2013   Procedure: CESAREAN SECTION;  Surgeon: Genia Del, MD;  Location: [facility];  Service: Obstetrics;  Laterality: N/A;     OB History    Gravida  2   Para  1   Term  1   Preterm      AB      Living  1     SAB      TAB      Ectopic      Multiple      Live Births  1            Home Medications    Prior to Admission medications   Medication Sig Start Date End Date Taking? Authorizing Provider  ondansetron (ZOFRAN ODT) 4 MG disintegrating tablet Take 1 tablet (4 mg total) by mouth every 8 (eight) hours as needed for nausea or vomiting. 02/07/17   Liberty Handy, PA-C    Family  History Family History  Problem Relation Age of Onset  . Obesity Mother   . Hypertension Maternal Grandmother   . Cancer Maternal Grandfather        Throat Cancer    Social History Social History   Tobacco Use  . Smoking status: Never Smoker  . Smokeless tobacco: Never Used  Substance Use Topics  . Alcohol use: Yes    Comment: occ  . Drug use: No     Allergies   Patient has no known allergies.   Review of Systems Review of Systems  Constitutional: Negative for activity change and appetite change.  HENT: Positive for congestion, rhinorrhea, sore throat and trouble swallowing.   Respiratory: Negative for cough and shortness of breath.   Cardiovascular: Negative for chest pain.  Gastrointestinal: Negative for abdominal pain, nausea and vomiting.  Genitourinary: Negative for dysuria and hematuria.  Musculoskeletal: Negative for arthralgias and myalgias.  Skin: Negative for rash.  Neurological: Negative for dizziness, weakness and headaches.   all other systems are negative except as noted in the HPI and PMH.     Physical Exam Updated Vital Signs BP 117/68 (BP Location: Left Arm)   Pulse 84   Temp 98.4 F (  36.9 C) (Oral)   Resp 16   Ht 5' (1.524 m)   Wt 105.6 kg (232 lb 12.9 oz)   SpO2 100%   BMI 45.47 kg/m   Physical Exam  Constitutional: She is oriented to person, place, and time. She appears well-developed and well-nourished. No distress.  HENT:  Head: Normocephalic and atraumatic.  Mouth/Throat: Oropharynx is clear and moist. No oropharyngeal exudate.  Uvula midline, no exudates, no asymmetry  Eyes: Pupils are equal, round, and reactive to light. Conjunctivae and EOM are normal.  Neck: Normal range of motion. Neck supple.  No meningismus.  Cardiovascular: Normal rate, regular rhythm, normal heart sounds and intact distal pulses.  No murmur heard. Pulmonary/Chest: Effort normal and breath sounds normal. No respiratory distress. She has no wheezes. She  exhibits no tenderness.  Abdominal: Soft. There is no tenderness. There is no rebound and no guarding.  Musculoskeletal: Normal range of motion. She exhibits no edema or tenderness.  Neurological: She is alert and oriented to person, place, and time. No cranial nerve deficit. She exhibits normal muscle tone. Coordination normal.  No ataxia on finger to nose bilaterally. No pronator drift. 5/5 strength throughout. CN 2-12 intact.Equal grip strength. Sensation intact.   Skin: Skin is warm.  Psychiatric: She has a normal mood and affect. Her behavior is normal.  Nursing note and vitals reviewed.    ED Treatments / Results  Labs (all labs ordered are listed, but only abnormal results are displayed) Labs Reviewed  [facility]  CULTURE, GROUP A STREP [facility])    EKG None  Radiology No results found.  Procedures Procedures (including critical care time)  Medications Ordered in ED Medications - No data to display   Initial Impression / Assessment and Plan / ED Course  I have reviewed the triage vital signs and the nursing notes.  Pertinent labs & imaging results that were available during my care of the patient were reviewed by me and considered in my medical decision making (see chart for details).    2 days of sore throat with congestion.  No airway issues.  No evidence of peritonsillar abscess.  Rapid strep in triage is negative.  Will treat symptomatically for likely viral pharyngitis with possible allergic component.  Will treat with antihistamine, antipyretics, p.o. fluids at home.  Follow-up with PCP.  Return precautions discussed. Final Clinical Impressions(s) / ED Diagnoses   Final diagnoses:  Pharyngitis, unspecified etiology    ED Discharge Orders    None       Tommie Bohlken, Jeannett SeniorStephen, MD 02/27/18 [phone]

## 2018-02-27 NOTE — Discharge Instructions (Signed)
Your strep test is negative.  You may gargle with salt water as instructed.  Keep yourself hydrated.  Use Tylenol or ibuprofen as needed for fever.  Follow-up with your doctor.  Return to the ED if you develop new or worsening symptoms.

## 2018-03-01 LAB — CULTURE, GROUP A STREP (THRC)

## 2018-10-20 DIAGNOSIS — Z1329 Encounter for screening for other suspected endocrine disorder: Secondary | ICD-10-CM | POA: Diagnosis not present

## 2018-10-20 DIAGNOSIS — Z113 Encounter for screening for infections with a predominantly sexual mode of transmission: Secondary | ICD-10-CM | POA: Diagnosis not present

## 2018-10-20 DIAGNOSIS — Z130 Encounter for screening for diseases of the blood and blood-forming organs and certain disorders involving the immune mechanism: Secondary | ICD-10-CM | POA: Diagnosis not present

## 2018-10-20 DIAGNOSIS — R300 Dysuria: Secondary | ICD-10-CM | POA: Diagnosis not present

## 2018-10-20 DIAGNOSIS — Z01419 Encounter for gynecological examination (general) (routine) without abnormal findings: Secondary | ICD-10-CM | POA: Diagnosis not present

## 2018-10-20 DIAGNOSIS — Z30432 Encounter for removal of intrauterine contraceptive device: Secondary | ICD-10-CM | POA: Diagnosis not present

## 2018-10-20 DIAGNOSIS — Z1159 Encounter for screening for other viral diseases: Secondary | ICD-10-CM | POA: Diagnosis not present

## 2018-10-20 DIAGNOSIS — Z118 Encounter for screening for other infectious and parasitic diseases: Secondary | ICD-10-CM | POA: Diagnosis not present

## 2018-10-20 DIAGNOSIS — Z114 Encounter for screening for human immunodeficiency virus [HIV]: Secondary | ICD-10-CM | POA: Diagnosis not present

## 2018-10-20 DIAGNOSIS — Z6841 Body Mass Index (BMI) 40.0 and over, adult: Secondary | ICD-10-CM | POA: Diagnosis not present

## 2018-10-20 DIAGNOSIS — R350 Frequency of micturition: Secondary | ICD-10-CM | POA: Diagnosis not present

## 2018-10-20 DIAGNOSIS — Z0000 Encounter for general adult medical examination without abnormal findings: Secondary | ICD-10-CM | POA: Diagnosis not present

## 2019-11-17 ENCOUNTER — Other Ambulatory Visit: Payer: Self-pay

## 2019-11-17 ENCOUNTER — Encounter (INDEPENDENT_AMBULATORY_CARE_PROVIDER_SITE_OTHER): Payer: Self-pay | Admitting: Family Medicine

## 2019-11-17 ENCOUNTER — Ambulatory Visit (INDEPENDENT_AMBULATORY_CARE_PROVIDER_SITE_OTHER): Payer: 59 | Admitting: Family Medicine

## 2019-11-17 VITALS — BP 110/69 | HR 80 | Temp 98.3°F | Ht 61.0 in | Wt 243.0 lb

## 2019-11-17 DIAGNOSIS — Z9189 Other specified personal risk factors, not elsewhere classified: Secondary | ICD-10-CM

## 2019-11-17 DIAGNOSIS — Z0289 Encounter for other administrative examinations: Secondary | ICD-10-CM

## 2019-11-17 DIAGNOSIS — D649 Anemia, unspecified: Secondary | ICD-10-CM

## 2019-11-17 DIAGNOSIS — E559 Vitamin D deficiency, unspecified: Secondary | ICD-10-CM

## 2019-11-17 DIAGNOSIS — Z6841 Body Mass Index (BMI) 40.0 and over, adult: Secondary | ICD-10-CM

## 2019-11-17 DIAGNOSIS — R739 Hyperglycemia, unspecified: Secondary | ICD-10-CM

## 2019-11-17 DIAGNOSIS — E7849 Other hyperlipidemia: Secondary | ICD-10-CM

## 2019-11-17 DIAGNOSIS — F3289 Other specified depressive episodes: Secondary | ICD-10-CM

## 2019-11-17 DIAGNOSIS — R0602 Shortness of breath: Secondary | ICD-10-CM

## 2019-11-17 DIAGNOSIS — R5383 Other fatigue: Secondary | ICD-10-CM

## 2019-11-18 LAB — T4, FREE: Free T4: 1.15 ng/dL (ref 0.82–1.77)

## 2019-11-18 LAB — ANEMIA PANEL
Ferritin: 56 ng/mL (ref 15–150)
Folate, Hemolysate: 278 ng/mL
Folate, RBC: 715 ng/mL (ref >498)
Hematocrit: 38.9 % (ref 34.0–46.6)
Iron Saturation: 23 % (ref 15–55)
Iron: 67 ug/dL (ref 27–159)
Retic Ct Pct: 1.6 % (ref 0.6–2.6)
Total Iron Binding Capacity: 297 ug/dL (ref 250–450)
UIBC: 230 ug/dL (ref 131–425)
Vitamin B-12: 265 pg/mL (ref 232–1245)

## 2019-11-18 LAB — CBC WITH DIFFERENTIAL/PLATELET
Basophils Absolute: 0 10*3/uL (ref 0.0–0.2)
Basos: 0 %
EOS (ABSOLUTE): 0.1 10*3/uL (ref 0.0–0.4)
Eos: 2 %
Hemoglobin: 11.6 g/dL (ref 11.1–15.9)
Immature Grans (Abs): 0 10*3/uL (ref 0.0–0.1)
Immature Granulocytes: 0 %
Lymphocytes Absolute: 1.9 10*3/uL (ref 0.7–3.1)
Lymphs: 27 %
MCH: 23.9 pg — ABNORMAL LOW (ref 26.6–33.0)
MCHC: 29.8 g/dL — ABNORMAL LOW (ref 31.5–35.7)
MCV: 80 fL (ref 79–97)
Monocytes Absolute: 0.4 10*3/uL (ref 0.1–0.9)
Monocytes: 7 %
Neutrophils Absolute: 4.3 10*3/uL (ref 1.4–7.0)
Neutrophils: 64 %
Platelets: 258 10*3/uL (ref 150–450)
RBC: 4.85 x10E6/uL (ref 3.77–5.28)
RDW: 12 % (ref 11.7–15.4)
WBC: 6.8 10*3/uL (ref 3.4–10.8)

## 2019-11-18 LAB — COMPREHENSIVE METABOLIC PANEL
ALT: 18 IU/L (ref 0–32)
AST: 19 IU/L (ref 0–40)
Albumin/Globulin Ratio: 1.8 (ref 1.2–2.2)
Albumin: 4.4 g/dL (ref 3.9–5.0)
Alkaline Phosphatase: 69 IU/L (ref 39–117)
BUN/Creatinine Ratio: 18 (ref 9–23)
BUN: 13 mg/dL (ref 6–20)
Bilirubin Total: 0.5 mg/dL (ref 0.0–1.2)
CO2: 23 mmol/L (ref 20–29)
Calcium: 9.2 mg/dL (ref 8.7–10.2)
Chloride: 102 mmol/L (ref 96–106)
Creatinine, Ser: 0.73 mg/dL (ref 0.57–1.00)
GFR calc Af Amer: 131 mL/min/{1.73_m2} (ref >59)
GFR calc non Af Amer: 114 mL/min/{1.73_m2} (ref >59)
Globulin, Total: 2.5 g/dL (ref 1.5–4.5)
Glucose: 83 mg/dL (ref 65–99)
Potassium: 4.1 mmol/L (ref 3.5–5.2)
Sodium: 138 mmol/L (ref 134–144)
Total Protein: 6.9 g/dL (ref 6.0–8.5)

## 2019-11-18 LAB — TSH: TSH: 0.854 u[IU]/mL (ref 0.450–4.500)

## 2019-11-18 LAB — LIPID PANEL WITH LDL/HDL RATIO
Cholesterol, Total: 167 mg/dL (ref 100–199)
HDL: 53 mg/dL (ref >39)
LDL Chol Calc (NIH): 104 mg/dL — ABNORMAL HIGH (ref 0–99)
LDL/HDL Ratio: 2 ratio (ref 0.0–3.2)
Triglycerides: 51 mg/dL (ref 0–149)
VLDL Cholesterol Cal: 10 mg/dL (ref 5–40)

## 2019-11-18 LAB — HEMOGLOBIN A1C
Est. average glucose Bld gHb Est-mCnc: 94 mg/dL
Hgb A1c MFr Bld: 4.9 % (ref 4.8–5.6)

## 2019-11-18 LAB — T3: T3, Total: 137 ng/dL (ref 71–180)

## 2019-11-18 LAB — VITAMIN D 25 HYDROXY (VIT D DEFICIENCY, FRACTURES): Vit D, 25-Hydroxy: 11.2 ng/mL — ABNORMAL LOW (ref 30.0–100.0)

## 2019-11-18 LAB — INSULIN, RANDOM: INSULIN: 16.2 u[IU]/mL (ref 2.6–24.9)

## 2019-11-18 NOTE — Progress Notes (Signed)
Dear Dr. Cherly Hensen,   Thank you for referring Megan Ball to our clinic. The following note includes my evaluation and treatment recommendations.  Chief Complaint:   OBESITY Megan Ball (MR# [id number]) is a 29 y.o. female who presents for evaluation and treatment of obesity and related comorbidities. Current BMI is Body mass index is 45.91 kg/m.Megan Ball Megan Ball has been struggling with her weight for many years and has been unsuccessful in either losing weight, maintaining weight loss, or reaching her healthy weight goal.  Megan Ball states she is currently in the action stage of change and ready to dedicate time achieving and maintaining a healthier weight. Megan Ball is interested in becoming our patient and working on intensive lifestyle modifications including (but not limited to) diet and exercise for weight loss.  Megan Ball's habits were reviewed today and are as follows: Her family eats meals together, she thinks her family might eat healthier with her, her desired weight loss is 54 pounds, she has been heavy most of her life, she started gaining weight after she gave birth to her child, her heaviest weight ever was 250 pounds, she is a picky eater and doesn't like to eat some healthy foods, she skips breakfast everyday and she is frequently drinking sweetened beverages.  Megan Ball feels that she makes poor food choices.  Depression Screen Megan Ball's Food and Mood (modified PHQ-9) score was 13.  Depression screen PHQ 2/9 11/17/2019  Decreased Interest 1  Down, Depressed, Hopeless 0  PHQ - 2 Score 1  Altered sleeping 3  Tired, decreased energy 3  Change in appetite 3  Feeling bad or failure about yourself  0  Trouble concentrating 3  Moving slowly or fidgety/restless 0  Suicidal thoughts 0  PHQ-9 Score 13  Difficult doing work/chores Somewhat difficult   Subjective:   1. Other fatigue Megan Ball feels her energy is lower than it should be. This has worsened with weight gain and has  worsened recently. Megan Ball denies daytime somnolence and admits to waking up still tired. Patient is at risk for obstructive sleep apnea.  Patient generally gets 6-8 hours of sleep per night.  Snoring is present. Apneic episodes are not present. Epworth Sleepiness Score is 4.  2. SOB (shortness of breath) on exertion Megan Ball notes increasing shortness of breath with exercising and seems to be worsening over time with weight gain. She notes getting out of breath sooner with activity than she used to. This has gotten worse recently. Megan Ball denies shortness of breath at rest or orthopnea.  3. Anemia, unspecified type She is not a vegetarian.  She does not have a history of weight loss surgery.   CBC Latest Ref Rng & Units 11/17/2019 02/06/2017 12/02/2013  WBC 3.4 - 10.8 x10E3/uL 6.8 6.3 6.3  Hemoglobin 11.1 - 15.9 g/dL 89.3 73.4 10.2(L)  Hematocrit 34.0 - 46.6 % 38.9 36.4 33.9(L)  Platelets 150 - 450 x10E3/uL 258 208 288   Lab Results  Component Value Date   IRON 67 11/17/2019   TIBC 297 11/17/2019   FERRITIN 56 11/17/2019   4. Other hyperlipidemia Megan Ball has hyperlipidemia and has been trying to improve her cholesterol levels with intensive lifestyle modification including a low saturated fat diet, exercise and weight loss. She denies any chest pain, claudication or myalgias.  Lab Results  Component Value Date   ALT 18 11/17/2019   AST 19 11/17/2019   ALKPHOS 69 11/17/2019   BILITOT 0.5 11/17/2019   Lab Results  Component Value Date   CHOL 167 11/17/2019  HDL 53 11/17/2019   LDLCALC 104 (H) 11/17/2019   TRIG 51 11/17/2019   5. Vitamin D deficiency She is not currently taking vit D. She denies nausea, vomiting or muscle weakness.  6. Hyperglycemia Megan Ball has a history of some elevated blood glucose readings without a diagnosis of diabetes. She denies polyphagia.  7. Other depression, with emotional eating  Megan Ball is struggling with emotional eating and using food for comfort  to the extent that it is negatively impacting her health. She often snacks when she is not hungry. Megan Ball sometimes feels she is out of control and then feels guilty that she made poor food choices. She has been working on behavior modification techniques to help reduce her emotional eating and has been unsuccessful. She shows no sign of suicidal or homicidal ideations.  8. At risk for obstructive sleep apnea Megan Ball is at increased risk for sleep apnea due to obesity.  Assessment/Plan:   1. Other fatigue Megan Ball was informed fatigue may be related to obesity, depression or many other causes. Labs will be ordered, and in the meanwhile Megan Ball has agreed to work on diet, exercise and weight loss.  Orders - EKG 12-Lead - T3 - T4, free - TSH - Anemia panel  2. SOB (shortness of breath) on exertion Megan Ball's shortness of breath appears to be obesity related and exercise induced. She has agreed to work on weight loss and gradually increase exercise to treat her exercise induced shortness of breath. Will continue to monitor closely.  Orders - CBC w/Diff/Platelet  3. Anemia, unspecified type Orders and follow up as documented in patient record.  Counseling . Iron is essential for our bodies to make red blood cells.  Reasons that someone may be deficient include: an iron-deficient diet (more likely in those following vegan or vegetarian diets), women with heavy menses, patients with GI disorders or poor absorption, patients that have had bariatric surgery, frequent blood donors, patients with cancer, and patients with heart disease.   Megan Ball Cola foods include dark leafy greens, red and white meats, eggs, seafood, and beans.   . Certain foods and drinks prevent your body from absorbing iron properly. Avoid eating these foods in the same meal as iron-rich foods or with iron supplements. These foods include: coffee, black tea, and red wine; milk, dairy products, and foods that are high in  calcium; beans and soybeans; whole grains.   Orders - Anemia panel - CBC w/Diff/Platelet  4. Other hyperlipidemia Cardiovascular risk and specific lipid/LDL goals reviewed.  We discussed several lifestyle modifications today and Nishtha will continue to work on diet, exercise and weight loss efforts. Orders and follow up as documented in patient record.   Counseling Intensive lifestyle modifications are the first line treatment for this issue. . Dietary changes: Increase soluble fiber. Decrease simple carbohydrates. . Exercise changes: Moderate to vigorous-intensity aerobic activity 150 minutes per week if tolerated. . Lipid-lowering medications: see documented in medical record.  Orders - Lipid Panel With LDL/HDL Ratio - Comprehensive Metabolic Panel (CMET)  5. Vitamin D deficiency Low Vitamin D level contributes to fatigue and are associated with obesity, breast, and colon cancer. She agrees to continue to take prescription Vitamin D @50 ,000 IU every week and will follow-up for routine testing of vitamin D, at least 2-3 times per year to avoid over-replacement.  Orders - Vitamin D (25 hydroxy)  6. Hyperglycemia Fasting labs will be obtained and results with be discussed with Addylynn in 2 weeks at her follow up visit. In the  meanwhile Madina was started on a lower simple carbohydrate diet and will work on weight loss efforts.  Orders - HgB A1c - Insulin, random - Comprehensive Metabolic Panel (CMET)  7. Other depression, with emotional eating  Behavior modification techniques were discussed today to help Jaidah deal with her emotional/non-hunger eating behaviors.  Orders and follow up as documented in patient record.   8. At risk for obstructive sleep apnea Taralee was given approximately 15 minutes of coronary artery disease prevention counseling today. She is 29 y.o. female and has risk factors for obstructive sleep apnea including obesity. We discussed intensive lifestyle  modifications today with an emphasis on specific weight loss instructions and strategies.  9. Class 3 severe obesity with serious comorbidity and body mass index (BMI) of 45.0 to 49.9 in adult, unspecified obesity type (HCC) Donni is currently in the action stage of change and her goal is to continue with weight loss efforts. I recommend Kayanna begin the structured treatment plan as follows:  She has agreed to on the Category 1 Plan.  We discussed the following exercise goals today: For substantial health benefits, adults should do at least 150 minutes (2 hours and 30 minutes) a week of moderate-intensity, or 75 minutes (1 hour and 15 minutes) a week of vigorous-intensity aerobic physical activity, or an equivalent combination of moderate- and vigorous-intensity aerobic activity. Aerobic activity should be performed in episodes of at least 10 minutes, and preferably, it should be spread throughout the week. Adults should also include muscle-strengthening activities that involve all major muscle groups on 2 or more days a week. We discussed the following behavioral modification strategies today: increasing lean protein intake, decreasing simple carbohydrates, increasing vegetables, increasing water intake, decreasing liquid calories and meal planning and cooking strategies.  She was informed of the importance of frequent follow-up visits to maximize her success with intensive lifestyle modifications for her multiple health conditions. She was informed we would discuss her lab results at her next visit unless there is a critical issue that needs to be addressed sooner. Adrian agreed to keep her next visit at the agreed upon time to discuss these results.  Objective:   Blood pressure 110/69, pulse 80, temperature 98.3 F (36.8 C), height 5\' 1"  (1.549 m), weight 243 lb (110.2 kg), last menstrual period 10/29/2019, SpO2 99 %, currently breastfeeding. Body mass index is 45.91 kg/m.  EKG: Normal sinus  rhythm, rate 82 bpm.  Indirect Calorimeter completed today shows a VO2 of 143 and a REE of 997.  Her calculated basal metabolic rate is 10/31/2019 thus her basal metabolic rate is worse than expected.  General: Cooperative, alert, well developed, in no acute distress. HEENT: Conjunctivae and lids unremarkable. Neck: No thyromegaly.  Cardiovascular: Regular rhythm.  Lungs: Normal work of breathing. Extremities: No edema.  Neurologic: No focal deficits.   Lab Results  Component Value Date   CREATININE 0.73 11/17/2019   BUN 13 11/17/2019   NA 138 11/17/2019   K 4.1 11/17/2019   CL 102 11/17/2019   CO2 23 11/17/2019   Lab Results  Component Value Date   ALT 18 11/17/2019   AST 19 11/17/2019   ALKPHOS 69 11/17/2019   BILITOT 0.5 11/17/2019   Lab Results  Component Value Date   HGBA1C 4.9 11/17/2019   Lab Results  Component Value Date   INSULIN 16.2 11/17/2019   Lab Results  Component Value Date   TSH 0.854 11/17/2019   Lab Results  Component Value Date   CHOL 167  11/17/2019   HDL 53 11/17/2019   LDLCALC 104 (H) 11/17/2019   TRIG 51 11/17/2019   Lab Results  Component Value Date   WBC 6.8 11/17/2019   HGB 11.6 11/17/2019   HCT 38.9 11/17/2019   MCV 80 11/17/2019   PLT 258 11/17/2019   Lab Results  Component Value Date   IRON 67 11/17/2019   TIBC 297 11/17/2019   FERRITIN 56 11/17/2019   Attestation Statements:   Reviewed by clinician on day of visit: allergies, medications, problem list, medical history, surgical history, family history, social history and previous encounter notes.  This visit occurred during the SARS-CoV-2 public health emergency.  Safety protocols were in place, including screening questions prior to the visit, additional usage of staff PPE, and extensive cleaning of exam room while observing appropriate contact time as indicated for disinfecting solutions. (CPT [id number])  I, Water quality scientist, CMA, am acting as transcriptionist for [employer],  DO.  I have reviewed the above documentation for accuracy and completeness, and I agree with the above. Briscoe Deutscher, DO

## 2019-11-22 ENCOUNTER — Encounter (INDEPENDENT_AMBULATORY_CARE_PROVIDER_SITE_OTHER): Payer: Self-pay | Admitting: Family Medicine

## 2019-12-01 ENCOUNTER — Ambulatory Visit (INDEPENDENT_AMBULATORY_CARE_PROVIDER_SITE_OTHER): Payer: 59 | Admitting: Bariatrics

## 2019-12-01 ENCOUNTER — Other Ambulatory Visit: Payer: Self-pay

## 2019-12-01 ENCOUNTER — Encounter (INDEPENDENT_AMBULATORY_CARE_PROVIDER_SITE_OTHER): Payer: Self-pay | Admitting: Bariatrics

## 2019-12-01 ENCOUNTER — Ambulatory Visit (INDEPENDENT_AMBULATORY_CARE_PROVIDER_SITE_OTHER): Payer: Self-pay | Admitting: Family Medicine

## 2019-12-01 VITALS — BP 131/83 | HR 83 | Temp 98.8°F | Ht 61.0 in | Wt 242.0 lb

## 2019-12-01 DIAGNOSIS — D508 Other iron deficiency anemias: Secondary | ICD-10-CM

## 2019-12-01 DIAGNOSIS — Z6841 Body Mass Index (BMI) 40.0 and over, adult: Secondary | ICD-10-CM

## 2019-12-01 DIAGNOSIS — E559 Vitamin D deficiency, unspecified: Secondary | ICD-10-CM

## 2019-12-01 DIAGNOSIS — E8881 Metabolic syndrome: Secondary | ICD-10-CM

## 2019-12-01 DIAGNOSIS — Z9189 Other specified personal risk factors, not elsewhere classified: Secondary | ICD-10-CM

## 2019-12-01 MED ORDER — VITAMIN D (ERGOCALCIFEROL) 1.25 MG (50000 UNIT) PO CAPS: 50000.0000 [IU] | ORAL_CAPSULE | ORAL | 0 refills | Status: DC

## 2019-12-05 NOTE — Progress Notes (Signed)
Chief Complaint:   OBESITY Megan Ball is here to discuss her progress with her obesity treatment plan along with follow-up of her obesity related diagnoses. Megan Ball is on the Category 1 Plan and states she is following her eating plan approximately 95% of the time. Megan Ball states she is walking for 30 minutes 2-3 times per week.  Today's visit was #: 2 Starting weight: 243 lbs Starting date: 11/17/2019 Today's weight: 242 lbs Today's date: 12/01/2019 Total lbs lost to date: 1 Total lbs lost since last in-office visit: 1  Interim History: Megan Ball is down 1 lbs. She states that she did good, but she had 1 cheat day. She states that she had to force herself to eat. She is drinking more water. She is up 3 lbs in water weight.   Subjective:   1. Vitamin D deficiency Megan Ball is not on Vit D, and last Vit D level was 11.2 on 11/17/2019.  2. Insulin resistance Megan Ball's last insulin level was 16.2 on 11/17/2019. She notes decrease in appetite.  3. Hypochromic red blood cells Megan Ball has a low normal Hgb and ferrtin. Last B12 level was 265 (low normal).  4. At risk for anemia Megan Ball is at risk for anemia due to hypochromic red blood cells. She is not on iron supplementation.  Assessment/Plan:   1. Vitamin D deficiency Low Vitamin D level contributes to fatigue and are associated with obesity, breast, and colon cancer. Megan Ball agreed to start prescription Vit D 50,000 IU every week. She will follow-up for routine testing of Vitamin D, at least 2-3 times per year to avoid over-replacement. We will continue to monitor.  - Vitamin D, Ergocalciferol, (DRISDOL) 1.25 MG (50000 UNIT) CAPS capsule; Take 1 capsule (50,000 Units total) by mouth every 7 (seven) days.  Dispense: 4 capsule; Refill: 0  2. Insulin resistance Megan Ball will continue to work on weight loss, exercise, and decreasing simple carbohydrates to help decrease the risk of diabetes. She was given informations on insulin resistance and  pre-diabetes. Megan Ball agreed to follow-up with [name] as directed to closely monitor her progress.  3. Hypochromic red blood cells We will watch her CBC over time, and Megan Ball will start B12 1,000 mcg 1 tablet daily. We will continue to monitor.  4. At risk for anemia Megan Ball will continue to follow up and she will start B12 supplement.  5. Class 3 severe obesity with serious comorbidity and body mass index (BMI) of 45.0 to 49.9 in adult, unspecified obesity type (HCC) Megan Ball is currently in the action stage of change. As such, her goal is to continue with weight loss efforts. She has agreed to the Category 1 Plan with additional lunch options. Recipes were given today.   I reviewed CMP, Lipid, iron and anemia panel, Vit D, and Vit B12.   Exercise goals: Megan Ball is to continue with her current walking regimen.  Behavioral modification strategies: increasing lean protein intake, decreasing simple carbohydrates, increasing vegetables, increasing water intake, decreasing eating out, no skipping meals, meal planning and cooking strategies, keeping healthy foods in the home and planning for success.  Megan Ball has agreed to follow-up with our clinic in 2 weeks. She was informed of the importance of frequent follow-up visits to maximize her success with intensive lifestyle modifications for her multiple health conditions.   Objective:   Blood pressure 131/83, pulse 83, temperature 98.8 F (37.1 C), height 5\' 1"  (1.549 m), weight 242 lb (109.8 kg), last menstrual period 11/25/2019, SpO2 100 %, currently breastfeeding. Body mass  index is 45.73 kg/m.  General: Cooperative, alert, well developed, in no acute distress. HEENT: Conjunctivae and lids unremarkable. Cardiovascular: Regular rhythm.  Lungs: Normal work of breathing. Neurologic: No focal deficits.   Lab Results  Component Value Date   CREATININE 0.73 11/17/2019   BUN 13 11/17/2019   NA 138 11/17/2019   K 4.1 11/17/2019   CL 102  11/17/2019   CO2 23 11/17/2019   Lab Results  Component Value Date   ALT 18 11/17/2019   AST 19 11/17/2019   ALKPHOS 69 11/17/2019   BILITOT 0.5 11/17/2019   Lab Results  Component Value Date   HGBA1C 4.9 11/17/2019   Lab Results  Component Value Date   INSULIN 16.2 11/17/2019   Lab Results  Component Value Date   TSH 0.854 11/17/2019   Lab Results  Component Value Date   CHOL 167 11/17/2019   HDL 53 11/17/2019   LDLCALC 104 (H) 11/17/2019   TRIG 51 11/17/2019   Lab Results  Component Value Date   WBC 6.8 11/17/2019   HGB 11.6 11/17/2019   HCT 38.9 11/17/2019   MCV 80 11/17/2019   PLT 258 11/17/2019   Lab Results  Component Value Date   IRON 67 11/17/2019   TIBC 297 11/17/2019   FERRITIN 56 11/17/2019   Attestation Statements:   Reviewed by clinician on day of visit: allergies, medications, problem list, medical history, surgical history, family history, social history, and previous encounter notes.   Trude Mcburney, am acting as Energy manager for [employer], DO.  I have reviewed the above documentation for accuracy and completeness, and I agree with the above. Corinna Capra, DO

## 2019-12-06 ENCOUNTER — Encounter (INDEPENDENT_AMBULATORY_CARE_PROVIDER_SITE_OTHER): Payer: Self-pay | Admitting: Bariatrics

## 2019-12-19 ENCOUNTER — Other Ambulatory Visit: Payer: Self-pay

## 2019-12-19 ENCOUNTER — Ambulatory Visit (INDEPENDENT_AMBULATORY_CARE_PROVIDER_SITE_OTHER): Payer: 59 | Admitting: Family Medicine

## 2019-12-19 ENCOUNTER — Encounter (INDEPENDENT_AMBULATORY_CARE_PROVIDER_SITE_OTHER): Payer: Self-pay | Admitting: Family Medicine

## 2019-12-19 VITALS — BP 105/71 | HR 74 | Temp 98.1°F | Ht 61.0 in | Wt 240.0 lb

## 2019-12-19 DIAGNOSIS — E538 Deficiency of other specified B group vitamins: Secondary | ICD-10-CM | POA: Diagnosis not present

## 2019-12-19 DIAGNOSIS — E8881 Metabolic syndrome: Secondary | ICD-10-CM | POA: Diagnosis not present

## 2019-12-19 DIAGNOSIS — E559 Vitamin D deficiency, unspecified: Secondary | ICD-10-CM | POA: Diagnosis not present

## 2019-12-19 DIAGNOSIS — Z9189 Other specified personal risk factors, not elsewhere classified: Secondary | ICD-10-CM

## 2019-12-19 DIAGNOSIS — Z6841 Body Mass Index (BMI) 40.0 and over, adult: Secondary | ICD-10-CM

## 2019-12-19 NOTE — Progress Notes (Signed)
Chief Complaint:   OBESITY Megan Ball is here to discuss her progress with her obesity treatment plan along with follow-up of her obesity related diagnoses. Megan Ball is on the Category 1 Plan with additional lunch options and states she is following her eating plan approximately 100% of the time. Megan Ball states she is walking on the track for 30 minutes 2-3 times per week.  Today's visit was #: 3 Starting weight: 243 lbs Starting date: 11/17/2019 Today's weight: 240 lbs Today's date: 12/19/2019 Total lbs lost to date: 3 lbs Total lbs lost since last in-office visit: 2 lbs  Interim History: Megan Ball says she has been eating on plan 100%.  She reports that she has no appetite for any type of food.  Her bowels are moving normally.  She denies nausea.  Subjective:   1. Vitamin D deficiency Megan Ball's Vitamin D level was 11.2 on 11/17/2019. She is currently taking vit D. She denies nausea, vomiting or muscle weakness.  2. Insulin resistance Megan Ball has a diagnosis of insulin resistance based on her elevated fasting insulin level >5. She continues to work on diet and exercise to decrease her risk of diabetes.  Lab Results  Component Value Date   INSULIN 16.2 11/17/2019   Lab Results  Component Value Date   HGBA1C 4.9 11/17/2019   3. Vitamin B12 deficiency She is not a vegetarian.  She does not have a previous diagnosis of pernicious anemia.  She does not have a history of weight loss surgery. She is currently taking over the counter vitamin B12.  Lab Results  Component Value Date   VITAMINB12 265 11/17/2019   4. At risk for deficient intake of food The patient is at a higher than average risk of deficient intake of food.  Assessment/Plan:   1. Vitamin D deficiency Low Vitamin D level contributes to fatigue and are associated with obesity, breast, and colon cancer. She agrees to continue to take prescription Vitamin D @50 ,000 IU every week and will follow-up for routine testing of  Vitamin D, at least 2-3 times per year to avoid over-replacement.  2. Insulin resistance [name] will continue to work on weight loss, exercise, and decreasing simple carbohydrates to help decrease the risk of diabetes. [name] agreed to follow-up with [name] as directed to closely monitor her progress.  3. Vitamin B12 deficiency The diagnosis was reviewed with the patient. Counseling provided today, see below. We will continue to monitor. Orders and follow up as documented in patient record.  Counseling . The body needs vitamin B12: to make red blood cells; to make DNA; and to help the nerves work properly so they can carry messages from the brain to the body.  . The main causes of vitamin B12 deficiency include dietary deficiency, digestive diseases, pernicious anemia, and having a surgery in which part of the stomach or small intestine is removed.  . Certain medicines can make it harder for the body to absorb vitamin B12. These medicines include: heartburn medications; some antibiotics; some medications used to treat diabetes, gout, and high cholesterol.  . In some cases, there are no symptoms of this condition. If the condition leads to anemia or nerve damage, various symptoms can occur, such as weakness or fatigue, shortness of breath, and numbness or tingling in your hands and feet.   . Treatment:  o May include taking vitamin B12 supplements.  o Avoid alcohol.  o Eat lots of healthy foods that contain vitamin B12: - Beef, pork, chicken, [place], and organ  meats, such as liver.  - Seafood: This includes clams, rainbow trout, salmon, tuna, and haddock. Eggs.  - Cereal and dairy products that are fortified: This means that vitamin B12 has been added to the food.   4. At risk for deficient intake of food Megan Ball was given approximately 15 minutes of deficit intake of food prevention counseling today. Megan Ball is at risk for eating too few calories based on current food recall. She was encouraged  to focus on meeting caloric and protein goals according to her recommended meal plan.   5. Class 3 severe obesity with serious comorbidity and body mass index (BMI) of 45.0 to 49.9 in adult, unspecified obesity type (HCC) [name] is currently in the action stage of change. As such, her goal is to continue with weight loss efforts. She has agreed to the Category 1 Plan.   Exercise goals: For substantial health benefits, adults should do at least 150 minutes (2 hours and 30 minutes) a week of moderate-intensity, or 75 minutes (1 hour and 15 minutes) a week of vigorous-intensity aerobic physical activity, or an equivalent combination of moderate- and vigorous-intensity aerobic activity. Aerobic activity should be performed in episodes of at least 10 minutes, and preferably, it should be spread throughout the week. Adults should also include muscle-strengthening activities that involve all major muscle groups on 2 or more days a week.  Behavioral modification strategies: increasing lean protein intake and increasing water intake.  Megan Ball has agreed to follow-up with our clinic in 2 weeks. She was informed of the importance of frequent follow-up visits to maximize her success with intensive lifestyle modifications for her multiple health conditions.   Objective:   Blood pressure 105/71, pulse 74, temperature 98.1 F (36.7 C), temperature source Oral, height 5\' 1"  (1.549 m), weight 240 lb (108.9 kg), last menstrual period 11/25/2019, SpO2 98 %, currently breastfeeding. Body mass index is 45.35 kg/m.  General: Cooperative, alert, well developed, in no acute distress. HEENT: Conjunctivae and lids unremarkable. Cardiovascular: Regular rhythm.  Lungs: Normal work of breathing. Neurologic: No focal deficits.   Lab Results  Component Value Date   CREATININE 0.73 11/17/2019   BUN 13 11/17/2019   NA 138 11/17/2019   K 4.1 11/17/2019   CL 102 11/17/2019   CO2 23 11/17/2019   Lab Results    Component Value Date   ALT 18 11/17/2019   AST 19 11/17/2019   ALKPHOS 69 11/17/2019   BILITOT 0.5 11/17/2019   Lab Results  Component Value Date   HGBA1C 4.9 11/17/2019   Lab Results  Component Value Date   INSULIN 16.2 11/17/2019   Lab Results  Component Value Date   TSH 0.854 11/17/2019   Lab Results  Component Value Date   CHOL 167 11/17/2019   HDL 53 11/17/2019   LDLCALC 104 (H) 11/17/2019   TRIG 51 11/17/2019   Lab Results  Component Value Date   WBC 6.8 11/17/2019   HGB 11.6 11/17/2019   HCT 38.9 11/17/2019   MCV 80 11/17/2019   PLT 258 11/17/2019   Lab Results  Component Value Date   IRON 67 11/17/2019   TIBC 297 11/17/2019   FERRITIN 56 11/17/2019   Attestation Statements:   Reviewed by clinician on day of visit: allergies, medications, problem list, medical history, surgical history, family history, social history, and previous encounter notes.  I, 01/15/2020, CMA, am acting as Insurance claims handler for Energy manager, DO.  I have reviewed the above documentation for accuracy and completeness, and I  agree with the above. Helane Rima, DO

## 2020-01-02 ENCOUNTER — Other Ambulatory Visit: Payer: Self-pay

## 2020-01-02 ENCOUNTER — Ambulatory Visit (INDEPENDENT_AMBULATORY_CARE_PROVIDER_SITE_OTHER): Payer: 59 | Admitting: Family Medicine

## 2020-01-02 ENCOUNTER — Encounter (INDEPENDENT_AMBULATORY_CARE_PROVIDER_SITE_OTHER): Payer: Self-pay | Admitting: Family Medicine

## 2020-01-02 VITALS — BP 110/70 | HR 77 | Temp 97.9°F | Ht 61.0 in | Wt 240.0 lb

## 2020-01-02 DIAGNOSIS — R630 Anorexia: Secondary | ICD-10-CM

## 2020-01-02 DIAGNOSIS — Z9189 Other specified personal risk factors, not elsewhere classified: Secondary | ICD-10-CM | POA: Diagnosis not present

## 2020-01-02 DIAGNOSIS — E559 Vitamin D deficiency, unspecified: Secondary | ICD-10-CM | POA: Diagnosis not present

## 2020-01-02 DIAGNOSIS — E8881 Metabolic syndrome: Secondary | ICD-10-CM

## 2020-01-02 DIAGNOSIS — E538 Deficiency of other specified B group vitamins: Secondary | ICD-10-CM

## 2020-01-02 DIAGNOSIS — R0600 Dyspnea, unspecified: Secondary | ICD-10-CM | POA: Diagnosis not present

## 2020-01-02 DIAGNOSIS — R0609 Other forms of dyspnea: Secondary | ICD-10-CM

## 2020-01-02 DIAGNOSIS — E88819 Insulin resistance, unspecified: Secondary | ICD-10-CM

## 2020-01-02 DIAGNOSIS — Z6841 Body Mass Index (BMI) 40.0 and over, adult: Secondary | ICD-10-CM

## 2020-01-02 MED ORDER — VITAMIN D (ERGOCALCIFEROL) 1.25 MG (50000 UNIT) PO CAPS: 50000.0000 [IU] | ORAL_CAPSULE | ORAL | 0 refills | Status: DC

## 2020-01-02 MED ORDER — METFORMIN HCL 500 MG PO TABS: 500.0000 mg | ORAL_TABLET | Freq: Every day | ORAL | 0 refills | Status: DC

## 2020-01-02 NOTE — Progress Notes (Signed)
Chief Complaint:   OBESITY Megan Ball is here to discuss her progress with her obesity treatment plan along with follow-up of her obesity related diagnoses. Megan Ball is on the Category 1 Plan with additional meal options and states she is following her eating plan approximately 100% of the time. Megan Ball states she is walking for 30 minutes 1-2 times per week.  Today's visit was #: 4 Starting weight: 243 lbs Starting date: 11/17/2019 Today's weight: 240 lbs Today's date: 01/02/2020 Total lbs lost to date: 3 lbs Total lbs lost since last in-office visit: 0  Interim History: Megan Ball says she has been forcing herself to eat.  She has had no appetite for months.  There was no event that triggered this, no COVID, no abdominal pain or bloating, medications, constipation, or dyspepsia.  No feeling of being overly full.  She says she is just not hungry.    Subjective:   1. Dyspnea on exertion Crystol endorses some dyspnea on exertion.  2. Insulin resistance Megan Ball has a diagnosis of insulin resistance based on her elevated fasting insulin level >5. She continues to work on diet and exercise to decrease her risk of diabetes.  Lab Results  Component Value Date   INSULIN 16.2 11/17/2019   Lab Results  Component Value Date   HGBA1C 4.9 11/17/2019   3. Vitamin D deficiency Megan Ball's Vitamin D level was 11.2 on 11/17/2019. She is currently taking vit D. She denies nausea, vomiting or muscle weakness.  4. B12 deficiency She is not a vegetarian.  She does not have a previous diagnosis of pernicious anemia.  She does not have a history of weight loss surgery.   Lab Results  Component Value Date   VITAMINB12 265 11/17/2019   5. Decreased appetite Megan Ball reports that she does not have an appetite.  6. At risk for malnutrition Megan Ball is at increased risk for malnutrition.  Assessment/Plan:   1. Dyspnea on exertion Indirect calorimetry today.  2. Insulin resistance Megan Ball will  continue to work on weight loss, exercise, and decreasing simple carbohydrates to help decrease the risk of diabetes. Megan Ball agreed to follow-up with [name] as directed to closely monitor her progress.  Orders - metFORMIN (GLUCOPHAGE) 500 MG tablet; Take 1 tablet (500 mg total) by mouth daily.  Dispense: 30 tablet; Refill: 0  3. Vitamin D deficiency Low Vitamin D level contributes to fatigue and are associated with obesity, breast, and colon cancer. She agrees to continue to take prescription Vitamin D @50 ,000 IU every week and will follow-up for routine testing of Vitamin D, at least 2-3 times per year to avoid over-replacement.  Orders - Vitamin D, Ergocalciferol, (DRISDOL) 1.25 MG (50000 UNIT) CAPS capsule; Take 1 capsule (50,000 Units total) by mouth every 7 (seven) days.  Dispense: 4 capsule; Refill: 0  4. B12 deficiency Low Vitamin D level contributes to fatigue and are associated with obesity, breast, and colon cancer. She agrees to continue to take prescription Vitamin D @50 ,000 IU every week and will follow-up for routine testing of Vitamin D, at least 2-3 times per year to avoid over-replacement.  5. Decreased appetite Will monitor.  6. At risk for malnutrition Megan Ball was given approximately 15 minutes of counseling today regarding prevention of malnutrition.   7. Class 3 severe obesity with serious comorbidity and body mass index (BMI) of 45.0 to 49.9 in adult, unspecified obesity type (HCC) Megan Ball is currently in the action stage of change. As such, her goal is to continue with weight loss  efforts. She has agreed to the Category 1 Plan.   Exercise goals: As is.  Behavioral modification strategies: increasing lean protein intake, increasing water intake and meal planning and cooking strategies.  Megan Ball has agreed to follow-up with our clinic in 2 weeks. She was informed of the importance of frequent follow-up visits to maximize her success with intensive lifestyle modifications  for her multiple health conditions.   Objective:   Blood pressure 110/70, pulse 77, temperature 97.9 F (36.6 C), temperature source Oral, height 5\' 1"  (1.549 m), weight 240 lb (108.9 kg), last menstrual period 12/24/2019, SpO2 99 %, currently breastfeeding. Body mass index is 45.35 kg/m.  General: Cooperative, alert, well developed, in no acute distress. HEENT: Conjunctivae and lids unremarkable. Cardiovascular: Regular rhythm.  Lungs: Normal work of breathing. Neurologic: No focal deficits.   Lab Results  Component Value Date   CREATININE 0.73 11/17/2019   BUN 13 11/17/2019   NA 138 11/17/2019   K 4.1 11/17/2019   CL 102 11/17/2019   CO2 23 11/17/2019   Lab Results  Component Value Date   ALT 18 11/17/2019   AST 19 11/17/2019   ALKPHOS 69 11/17/2019   BILITOT 0.5 11/17/2019   Lab Results  Component Value Date   HGBA1C 4.9 11/17/2019   Lab Results  Component Value Date   INSULIN 16.2 11/17/2019   Lab Results  Component Value Date   TSH 0.854 11/17/2019   Lab Results  Component Value Date   CHOL 167 11/17/2019   HDL 53 11/17/2019   LDLCALC 104 (H) 11/17/2019   TRIG 51 11/17/2019   Lab Results  Component Value Date   WBC 6.8 11/17/2019   HGB 11.6 11/17/2019   HCT 38.9 11/17/2019   MCV 80 11/17/2019   PLT 258 11/17/2019   Lab Results  Component Value Date   IRON 67 11/17/2019   TIBC 297 11/17/2019   FERRITIN 56 11/17/2019   Attestation Statements:   Reviewed by clinician on day of visit: allergies, medications, problem list, medical history, surgical history, family history, social history, and previous encounter notes.  I, 01/15/2020, CMA, am acting as Insurance claims handler for Energy manager, DO.  I have reviewed the above documentation for accuracy and completeness, and I agree with the above. [employer], DO

## 2020-01-05 ENCOUNTER — Encounter (INDEPENDENT_AMBULATORY_CARE_PROVIDER_SITE_OTHER): Payer: Self-pay

## 2020-01-11 ENCOUNTER — Ambulatory Visit (INDEPENDENT_AMBULATORY_CARE_PROVIDER_SITE_OTHER): Payer: 59 | Admitting: Family Medicine

## 2020-01-16 ENCOUNTER — Ambulatory Visit (INDEPENDENT_AMBULATORY_CARE_PROVIDER_SITE_OTHER): Payer: 59 | Admitting: Family Medicine

## 2020-01-16 ENCOUNTER — Encounter (INDEPENDENT_AMBULATORY_CARE_PROVIDER_SITE_OTHER): Payer: Self-pay | Admitting: Family Medicine

## 2020-01-16 ENCOUNTER — Other Ambulatory Visit: Payer: Self-pay

## 2020-01-16 VITALS — BP 113/71 | HR 86 | Temp 98.0°F | Ht 61.0 in | Wt 238.0 lb

## 2020-01-16 DIAGNOSIS — E538 Deficiency of other specified B group vitamins: Secondary | ICD-10-CM | POA: Diagnosis not present

## 2020-01-16 DIAGNOSIS — E559 Vitamin D deficiency, unspecified: Secondary | ICD-10-CM

## 2020-01-16 DIAGNOSIS — E8881 Metabolic syndrome: Secondary | ICD-10-CM | POA: Diagnosis not present

## 2020-01-16 DIAGNOSIS — Z9189 Other specified personal risk factors, not elsewhere classified: Secondary | ICD-10-CM | POA: Diagnosis not present

## 2020-01-16 DIAGNOSIS — Z6841 Body Mass Index (BMI) 40.0 and over, adult: Secondary | ICD-10-CM

## 2020-01-17 NOTE — Progress Notes (Signed)
Chief Complaint:   OBESITY Megan Ball is here to discuss her progress with her obesity treatment plan along with follow-up of her obesity related diagnoses. Megan Ball is on the Category 1 Plan with additional meal options and states she is following her eating plan approximately 100% of the time. Megan Ball states she is exercising for 0 minutes 0 times per week.  Today's visit was #: 5 Starting weight: 243 lbs Starting date: 11/17/2019 Today's weight: 238 lbs Today's date: 01/16/2020 Total lbs lost to date: 5 lbs Total lbs lost since last in-office visit: 2 lbs  Interim History: Megan Ball says she has increased energy and increased appetite.  She is making herself eat regularly.  She is ready to get out to exercise.  She says the metformin is helping her, but is causing some mild nausea.  She is taking it with food.  She has increased her water intake and is up 2 pounds of water weight.  Subjective:   1. B12 deficiency She is not a vegetarian.  She does not have a previous diagnosis of pernicious anemia.  She does not have a history of weight loss surgery.   Lab Results  Component Value Date   VITAMINB12 265 11/17/2019   2. Vitamin D deficiency Megan Ball's Vitamin D level was 11.2 on 11/17/2019. She is currently taking vit D. She denies nausea, vomiting or muscle weakness.  3. Insulin resistance Megan Ball has a diagnosis of insulin resistance based on her elevated fasting insulin level >5. She continues to work on diet and exercise to decrease her risk of diabetes.  She is taking metformin 500 mg daily.  Lab Results  Component Value Date   INSULIN 16.2 11/17/2019   Lab Results  Component Value Date   HGBA1C 4.9 11/17/2019   4. At risk for diabetes mellitus Megan Ball is at higher than average risk for developing diabetes due to her obesity.   Assessment/Plan:   1. B12 deficiency The diagnosis was reviewed with the patient. Counseling provided today, see below. We will continue to  monitor. Orders and follow up as documented in patient record.  Counseling . The body needs vitamin B12: to make red blood cells; to make DNA; and to help the nerves work properly so they can carry messages from the brain to the body.  . The main causes of vitamin B12 deficiency include dietary deficiency, digestive diseases, pernicious anemia, and having a surgery in which part of the stomach or small intestine is removed.  . Certain medicines can make it harder for the body to absorb vitamin B12. These medicines include: heartburn medications; some antibiotics; some medications used to treat diabetes, gout, and high cholesterol.  . In some cases, there are no symptoms of this condition. If the condition leads to anemia or nerve damage, various symptoms can occur, such as weakness or fatigue, shortness of breath, and numbness or tingling in your hands and feet.   . Treatment:  o May include taking vitamin B12 supplements.  o Avoid alcohol.  o Eat lots of healthy foods that contain vitamin B12: - Beef, pork, chicken, [place], and organ meats, such as liver.  - Seafood: This includes clams, rainbow trout, salmon, tuna, and haddock. Eggs.  - Cereal and dairy products that are fortified: This means that vitamin B12 has been added to the food.   2. Vitamin D deficiency Low Vitamin D level contributes to fatigue and are associated with obesity, breast, and colon cancer. She agrees to continue to take prescription  Vitamin D @50 ,000 IU every week and will follow-up for routine testing of Vitamin D, at least 2-3 times per year to avoid over-replacement.  3. Insulin resistance Megan Ball will continue to work on weight loss, exercise, and decreasing simple carbohydrates to help decrease the risk of diabetes. Megan Ball agreed to follow-up with [name] as directed to closely monitor her progress.  4. At risk for diabetes mellitus Megan Ball was given approximately 15 minutes of diabetes education and counseling today.  We discussed intensive lifestyle modifications today with an emphasis on weight loss as well as increasing exercise and decreasing simple carbohydrates in her diet. We also reviewed medication options with an emphasis on risk versus benefit of those discussed.   Repetitive spaced learning was employed today to elicit superior memory formation and behavioral change.  5. Class 3 severe obesity with serious comorbidity and body mass index (BMI) of 45.0 to 49.9 in adult, unspecified obesity type (HCC) Megan Ball is currently in the action stage of change. As such, her goal is to continue with weight loss efforts. She has agreed to the Category 1 Plan.   Exercise goals: Walk for 20 minutes 2-3 days a week.  Behavioral modification strategies: increasing lean protein intake and increasing water intake.  Megan Ball has agreed to follow-up with our clinic in 2 weeks. She was informed of the importance of frequent follow-up visits to maximize her success with intensive lifestyle modifications for her multiple health conditions.   Objective:   Blood pressure 113/71, pulse 86, temperature 98 F (36.7 C), temperature source Oral, height 5\' 1"  (1.549 m), weight 238 lb (108 kg), last menstrual period 12/24/2019, SpO2 99 %, currently breastfeeding. Body mass index is 44.97 kg/m.  General: Cooperative, alert, well developed, in no acute distress. HEENT: Conjunctivae and lids unremarkable. Cardiovascular: Regular rhythm.  Lungs: Normal work of breathing. Neurologic: No focal deficits.   Lab Results  Component Value Date   CREATININE 0.73 11/17/2019   BUN 13 11/17/2019   NA 138 11/17/2019   K 4.1 11/17/2019   CL 102 11/17/2019   CO2 23 11/17/2019   Lab Results  Component Value Date   ALT 18 11/17/2019   AST 19 11/17/2019   ALKPHOS 69 11/17/2019   BILITOT 0.5 11/17/2019   Lab Results  Component Value Date   HGBA1C 4.9 11/17/2019   Lab Results  Component Value Date   INSULIN 16.2 11/17/2019    Lab Results  Component Value Date   TSH 0.854 11/17/2019   Lab Results  Component Value Date   CHOL 167 11/17/2019   HDL 53 11/17/2019   LDLCALC 104 (H) 11/17/2019   TRIG 51 11/17/2019   Lab Results  Component Value Date   WBC 6.8 11/17/2019   HGB 11.6 11/17/2019   HCT 38.9 11/17/2019   MCV 80 11/17/2019   PLT 258 11/17/2019   Lab Results  Component Value Date   IRON 67 11/17/2019   TIBC 297 11/17/2019   FERRITIN 56 11/17/2019   Attestation Statements:   Reviewed by clinician on day of visit: allergies, medications, problem list, medical history, surgical history, family history, social history, and previous encounter notes.  I, Water quality scientist, CMA, am acting as Location manager for [employer], DO.  I have reviewed the above documentation for accuracy and completeness, and I agree with the above. Briscoe Deutscher, DO

## 2020-02-04 ENCOUNTER — Other Ambulatory Visit: Payer: Self-pay

## 2020-02-04 ENCOUNTER — Encounter (HOSPITAL_BASED_OUTPATIENT_CLINIC_OR_DEPARTMENT_OTHER): Payer: Self-pay

## 2020-02-04 ENCOUNTER — Emergency Department (HOSPITAL_BASED_OUTPATIENT_CLINIC_OR_DEPARTMENT_OTHER)
Admission: EM | Admit: 2020-02-04 | Discharge: 2020-02-04 | Disposition: A | Discharge status: Home / Self Care | Payer: 59 | Attending: Emergency Medicine | Admitting: Emergency Medicine

## 2020-02-04 ENCOUNTER — Emergency Department (HOSPITAL_BASED_OUTPATIENT_CLINIC_OR_DEPARTMENT_OTHER): Payer: 59

## 2020-02-04 DIAGNOSIS — Z7984 Long term (current) use of oral hypoglycemic drugs: Secondary | ICD-10-CM | POA: Diagnosis not present

## 2020-02-04 DIAGNOSIS — O034 Incomplete spontaneous abortion without complication: Secondary | ICD-10-CM | POA: Diagnosis not present

## 2020-02-04 DIAGNOSIS — Z3A01 Less than 8 weeks gestation of pregnancy: Secondary | ICD-10-CM | POA: Diagnosis not present

## 2020-02-04 DIAGNOSIS — O209 Hemorrhage in early pregnancy, unspecified: Secondary | ICD-10-CM | POA: Diagnosis present

## 2020-02-04 LAB — CBC WITH DIFFERENTIAL/PLATELET
Abs Immature Granulocytes: 0.02 10*3/uL (ref 0.00–0.07)
Basophils Absolute: 0 10*3/uL (ref 0.0–0.1)
Basophils Relative: 0 %
Eosinophils Absolute: 0.1 10*3/uL (ref 0.0–0.5)
Eosinophils Relative: 2 %
HCT: 36.8 % (ref 36.0–46.0)
Hemoglobin: 11.1 g/dL — ABNORMAL LOW (ref 12.0–15.0)
Immature Granulocytes: 0 %
Lymphocytes Relative: 28 %
Lymphs Abs: 2.1 10*3/uL (ref 0.7–4.0)
MCH: 23.5 pg — ABNORMAL LOW (ref 26.0–34.0)
MCHC: 30.2 g/dL (ref 30.0–36.0)
MCV: 78 fL — ABNORMAL LOW (ref 80.0–100.0)
Monocytes Absolute: 0.5 10*3/uL (ref 0.1–1.0)
Monocytes Relative: 7 %
Neutro Abs: 4.6 10*3/uL (ref 1.7–7.7)
Neutrophils Relative %: 63 %
Platelets: 253 10*3/uL (ref 150–400)
RBC: 4.72 MIL/uL (ref 3.87–5.11)
RDW: 13.3 % (ref 11.5–15.5)
WBC: 7.3 10*3/uL (ref 4.0–10.5)
nRBC: 0 % (ref 0.0–0.2)

## 2020-02-04 LAB — PREGNANCY, URINE: Preg Test, Ur: POSITIVE — AB

## 2020-02-04 LAB — HCG, QUANTITATIVE, PREGNANCY: hCG, Beta Chain, Quant, S: 117 m[IU]/mL — ABNORMAL HIGH (ref <5)

## 2020-02-04 NOTE — ED Provider Notes (Signed)
[facility] EMERGENCY DEPARTMENT Provider Note   CSN: [id number] Arrival date & time: 02/04/20  1207     History Chief Complaint  Patient presents with   Vaginal Bleeding    Megan Ball is a 29 y.o. female.  16LA F [id number] w/ LMP 12/24/19 who p/w vaginal bleeding. Pt had positive home pregnancy test recently. On 3/22, she started having light vaginal spotting and saw OBGYN on 3/23 where they confirmed pregnancy with blood test and gave reassurance as bleeding had stopped. She began having light spotting again today. She denies any associated pain or cramping. She had mild L sided cramping before the first time she spotted but it has not returned. No urinary symptoms, fevers, or recent illness. Currently taking prenatal vitamin.  The history is provided by the patient.  Vaginal Bleeding      Past Medical History:  Diagnosis Date   Anemia    Eczema    Gestational thrombocytopenia (HCC) 09/17/2013   Obesity    Postpartum care following cesarean delivery (11/7) 09/16/2013   Seasonal allergies    Shortness of breath     Patient Active Problem List   Diagnosis Date Noted   Backache 12/04/2013   Anemia 12/04/2013   Eczema 12/04/2013   Gestational thrombocytopenia (HCC) 09/17/2013   Postpartum care following cesarean delivery (11/7) 09/16/2013    Past Surgical History:  Procedure Laterality Date   CESAREAN SECTION N/A 09/16/2013   Procedure: CESAREAN SECTION;  Surgeon: Genia Del, MD;  Location: [facility];  Service: Obstetrics;  Laterality: N/A;   TONSILLECTOMY       OB History    Gravida  3   Para  1   Term  1   Preterm      AB      Living  1     SAB      TAB      Ectopic      Multiple      Live Births  1           Family History  Problem Relation Age of Onset   Obesity Mother    High blood pressure Father    Obesity Father    Hypertension Maternal Grandmother    Cancer Maternal Grandfather        Throat  Cancer    Social History   Tobacco Use   Smoking status: Never Smoker   Smokeless tobacco: Never Used  Substance Use Topics   Alcohol use: Yes    Comment: occ   Drug use: No    Home Medications Prior to Admission medications   Medication Sig Start Date End Date Taking? Authorizing Provider  metFORMIN (GLUCOPHAGE) 500 MG tablet Take 1 tablet (500 mg total) by mouth daily. 01/02/20   Helane Rima, DO  Vitamin D, Ergocalciferol, (DRISDOL) 1.25 MG (50000 UNIT) CAPS capsule Take 1 capsule (50,000 Units total) by mouth every 7 (seven) days. 01/02/20   Helane Rima, DO    Allergies    Patient has no known allergies.  Review of Systems   Review of Systems  Genitourinary: Positive for vaginal bleeding.   All other systems reviewed and are negative except that which was mentioned in HPI  Physical Exam Updated Vital Signs BP 138/74 (BP Location: Right Arm)    Pulse 87    Temp 98.6 F (37 C) (Oral)    Resp 16    Ht 5\' 1"  (1.549 m)    Wt 108 kg    LMP 12/24/2019  SpO2 100%    BMI 44.97 kg/m   Physical Exam Vitals and nursing note reviewed. Exam conducted with a chaperone present.  Constitutional:      General: She is not in acute distress.    Appearance: She is well-developed.  HENT:     Head: Normocephalic and atraumatic.  Eyes:     Conjunctiva/sclera: Conjunctivae normal.  Abdominal:     Palpations: Abdomen is soft.     Tenderness: There is no abdominal tenderness.  Genitourinary:    Comments: Moderate amount dark red blood in vaginal vault, no CMT or adnexal tenderness Musculoskeletal:     Cervical back: Neck supple.  Skin:    General: Skin is warm and dry.  Neurological:     Mental Status: She is alert and oriented to person, place, and time.  Psychiatric:        Judgment: Judgment normal.     ED Results / Procedures / Treatments   Labs (all labs ordered are listed, but only abnormal results are displayed) Labs Reviewed  PREGNANCY, URINE - Abnormal;  Notable for the following components:      Result Value   Preg Test, Ur POSITIVE (*)    All other components within normal limits  HCG, QUANTITATIVE, PREGNANCY - Abnormal; Notable for the following components:   hCG, Beta Chain, Quant, S 117 (*)    All other components within normal limits  CBC WITH DIFFERENTIAL/PLATELET - Abnormal; Notable for the following components:   Hemoglobin 11.1 (*)    MCV 78.0 (*)    MCH 23.5 (*)    All other components within normal limits  GC/CHLAMYDIA PROBE AMP ([place]) NOT AT [facility]    EKG None  Radiology US OB Comp < 14 Wks  Result Date: 02/04/2020 CLINICAL DATA:  Vaginal spotting today. Gestational age by last menstrual period is 6 weeks 0 days. Beta HCG is 117. EXAM: OBSTETRIC <14 WK ULTRASOUND; TRANSVAGINAL OB ULTRASOUND TECHNIQUE: Transvaginal ultrasound was performed for complete evaluation of the gestation as well as the maternal uterus, adnexal regions, and pelvic cul-de-sac. COMPARISON:  None. FINDINGS: Intrauterine gestational sac: No definite gestational sac is identified. Yolk sac:  Not Visualized. Embryo:  Not Visualized. Cardiac Activity: Not Visualized. Subchorionic hemorrhage:  None visualized. Maternal uterus/adnexae: A small amount of fluid is seen in the cervical canal. Trace free fluid is noted. IMPRESSION: Small amount of fluid in the cervical canal and no definite intrauterine gestational sac. Given that gestational age by last menstrual period is 6 weeks, these findings are suspicious but not yet definitive for failed pregnancy. Recommend follow-up US in 10-14 days for definitive diagnosis. This recommendation follows SRU consensus guidelines: Diagnostic Criteria for Nonviable Pregnancy Early in the First Trimester. [employer] Med 2013; 962:9528-41. These results were called by telephone at the time of interpretation on 02/04/2020 at 3:02 pm to provider Chinenye Katzenberger , who verbally acknowledged these results. Electronically Signed   By: Zerita Boers M.D.   On: 02/04/2020 15:04   US OB Transvaginal  Result Date: 02/04/2020 CLINICAL DATA:  Vaginal spotting today. Gestational age by last menstrual period is 6 weeks 0 days. Beta HCG is 117. EXAM: OBSTETRIC <14 WK ULTRASOUND; TRANSVAGINAL OB ULTRASOUND TECHNIQUE: Transvaginal ultrasound was performed for complete evaluation of the gestation as well as the maternal uterus, adnexal regions, and pelvic cul-de-sac. COMPARISON:  None. FINDINGS: Intrauterine gestational sac: No definite gestational sac is identified. Yolk sac:  Not Visualized. Embryo:  Not Visualized. Cardiac Activity: Not Visualized. Subchorionic  hemorrhage:  None visualized. Maternal uterus/adnexae: A small amount of fluid is seen in the cervical canal. Trace free fluid is noted. IMPRESSION: Small amount of fluid in the cervical canal and no definite intrauterine gestational sac. Given that gestational age by last menstrual period is 6 weeks, these findings are suspicious but not yet definitive for failed pregnancy. Recommend follow-up US in 10-14 days for definitive diagnosis. This recommendation follows SRU consensus guidelines: Diagnostic Criteria for Nonviable Pregnancy Early in the First Trimester. Malva Limes Med 2013; 409:8119-14. These results were called by telephone at the time of interpretation on 02/04/2020 at 3:02 pm to provider Eulalia Ellerman , who verbally acknowledged these results. Electronically Signed   By: Romona Curls M.D.   On: 02/04/2020 15:04    Procedures Procedures (including critical care time)  Medications Ordered in ED Medications - No data to display  ED Course  I have reviewed the triage vital signs and the nursing notes.  Pertinent labs & imaging results that were available during my care of the patient were reviewed by me and considered in my medical decision making (see chart for details).    MDM Rules/Calculators/A&P                      Well appearing on exam, reassuring VS. BHCG is 117, I am  unable to compare to previous value. Hgb 11.1 similar to previous. US shows no IUP, no evidence of ectopic, some fluid in cervix, suspect incomplete abortion based on low HCG and history. No evidence of ectopic however pt will need f/u hCG and [name]. Discussed read w/ radiology. I attempted several times to speak w/ OBGYN on call for patient's group without success. Discussed results and f/u plan with patient. Strongly emphasized importance of close OBGYN f/u (call on Monday) for repeat hCG and [name] scheduling. Reviewed return precautions including heavy bleeding, near syncope, or severe pain. Pt and mom voiced understanding.  Final Clinical Impression(s) / ED Diagnoses Final diagnoses:  Incomplete miscarriage    Rx / DC Orders ED Discharge Orders    None       Ezriel Boffa, Ambrose [place], MD 02/04/20 1525

## 2020-02-04 NOTE — Discharge Instructions (Signed)
IT IS VERY IMPORTANT FOR YOU TO CALL OBGYN CLINIC ON Monday FOR FOLLOW UP BLOOD HORMONE LEVEL AND REPEAT US SCHEDULING.

## 2020-02-04 NOTE — ED Notes (Signed)
Pt ambulatory with steady gait to restroom 

## 2020-02-04 NOTE — ED Triage Notes (Signed)
Pt states her last menstrual cycle was 12-24-19, states she had blood work done to confirm that she was pregnant. She started having some spotting on March 22nd, it went away and started again today.

## 2020-02-04 NOTE — ED Notes (Signed)
Pt made aware of the possible need for a urine sample. Unable to void at the moment.

## 2020-02-06 ENCOUNTER — Other Ambulatory Visit: Payer: Self-pay

## 2020-02-06 ENCOUNTER — Encounter (INDEPENDENT_AMBULATORY_CARE_PROVIDER_SITE_OTHER): Payer: Self-pay | Admitting: Family Medicine

## 2020-02-06 ENCOUNTER — Other Ambulatory Visit: Payer: Self-pay | Admitting: Obstetrics and Gynecology

## 2020-02-06 ENCOUNTER — Ambulatory Visit (INDEPENDENT_AMBULATORY_CARE_PROVIDER_SITE_OTHER): Payer: 59 | Admitting: Family Medicine

## 2020-02-06 VITALS — BP 117/71 | HR 87 | Temp 98.4°F | Ht 61.0 in | Wt 238.0 lb

## 2020-02-06 DIAGNOSIS — D649 Anemia, unspecified: Secondary | ICD-10-CM

## 2020-02-06 DIAGNOSIS — Z6841 Body Mass Index (BMI) 40.0 and over, adult: Secondary | ICD-10-CM

## 2020-02-06 DIAGNOSIS — O039 Complete or unspecified spontaneous abortion without complication: Secondary | ICD-10-CM

## 2020-02-06 DIAGNOSIS — E8881 Metabolic syndrome: Secondary | ICD-10-CM

## 2020-02-06 DIAGNOSIS — E559 Vitamin D deficiency, unspecified: Secondary | ICD-10-CM

## 2020-02-06 MED ORDER — METFORMIN HCL 500 MG PO TABS: 500.0000 mg | ORAL_TABLET | Freq: Every day | ORAL | 0 refills | Status: DC

## 2020-02-06 MED ORDER — VITAMIN D (ERGOCALCIFEROL) 1.25 MG (50000 UNIT) PO CAPS: 50000.0000 [IU] | ORAL_CAPSULE | ORAL | 0 refills | Status: DC

## 2020-02-06 NOTE — Progress Notes (Signed)
Chief Complaint:   OBESITY Megan Ball is here to discuss her progress with her obesity treatment plan along with follow-up of her obesity related diagnoses. Megan Ball is on the Category 1 Plan with lunch optionsand states she is following her eating plan approximately 100% of the time. Megan Ball states she is walking for 30 minutes 2-3 times per week.  Today's visit was #: 6 Starting weight: 243 lbs Starting date: 11/17/2019 Today's weight: 238 lbs Today's date: 02/06/2020 Total lbs lost to date: 5 lbs Total lbs lost since last in-office visit: 0  Interim History: Unfortunately, Megan Ball was evaluated in the ED 2 days ago for vaginal bleeding.  She had a positive urine pregnancy test.  Needs repeat BHCG.  She has no new symptoms.  She is tearful today.  Subjective:   1. Vitamin D deficiency [name]'s Vitamin D level was 11.2 on 11/17/2019. She is currently taking prescription vitamin D 50,000 IU each week. She denies nausea, vomiting or muscle weakness.  2. Insulin resistance [name] has a diagnosis of insulin resistance based on her elevated fasting insulin level >5. She continues to work on diet and exercise to decrease her risk of diabetes.  She is taking metformin 500 mg daily.  Lab Results  Component Value Date   INSULIN 16.2 11/17/2019   Lab Results  Component Value Date   HGBA1C 4.9 11/17/2019   3. Anemia, unspecified type Megan Ball is not a vegetarian.  She does not have a history of weight loss surgery.   CBC Latest Ref Rng & Units 02/04/2020 11/17/2019 02/06/2017  WBC 4.0 - 10.5 K/uL 7.3 6.8 6.3  Hemoglobin 12.0 - 15.0 g/dL 11.1(L) 11.6 12.1  Hematocrit 36.0 - 46.0 % 36.8 38.9 36.4  Platelets 150 - 400 K/uL 253 258 208   Lab Results  Component Value Date   IRON 67 11/17/2019   TIBC 297 11/17/2019   FERRITIN 56 11/17/2019   Lab Results  Component Value Date   VITAMINB12 265 11/17/2019   4. Incomplete miscarriage ED note was reviewed together.  The patient called her  OBGYN and is waiting to see when she can get evaluated.  Assessment/Plan:   1. Vitamin D deficiency Low Vitamin D level contributes to fatigue and are associated with obesity, breast, and colon cancer. She agrees to continue to take prescription Vitamin D @50 ,000 IU every week and will follow-up for routine testing of Vitamin D, at least 2-3 times per year to avoid over-replacement.  Orders - Vitamin D, Ergocalciferol, (DRISDOL) 1.25 MG (50000 UNIT) CAPS capsule; Take 1 capsule (50,000 Units total) by mouth every 7 (seven) days.  Dispense: 4 capsule; Refill: 0  2. Insulin resistance Megan Ball will continue to work on weight loss, exercise, and decreasing simple carbohydrates to help decrease the risk of diabetes. Megan Ball agreed to follow-up with Megan Ball as directed to closely monitor her progress.  Orders - metFORMIN (GLUCOPHAGE) 500 MG tablet; Take 1 tablet (500 mg total) by mouth daily.  Dispense: 30 tablet; Refill: 0  3. Anemia Will check anemia panel today due to new vaginal bleeding.  Orders - Comprehensive metabolic panel - Anemia panel  4. Incomplete miscarriage Will check labs today.  Provided work note x3 days.  Orders - CBC with Differential/Platelet - Beta HCG, Quant  5. Class 3 severe obesity with serious comorbidity and body mass index (BMI) of 45.0 to 49.9 in adult, unspecified obesity type (HCC) [name] is currently in the action stage of change. As such, her goal is to continue  with weight loss efforts. She has agreed to the Category 1 Plan.   Exercise goals: As is.  Behavioral modification strategies: emotional eating strategies.  Megan Ball has agreed to follow-up with our clinic in 2 weeks. She was informed of the importance of frequent follow-up visits to maximize her success with intensive lifestyle modifications for her multiple health conditions.   Megan Ball was informed we would discuss her lab results at her next visit unless there is a critical issue that needs to  be addressed sooner. Megan Ball agreed to keep her next visit at the agreed upon time to discuss these results.  Objective:   Blood pressure 117/71, pulse 87, temperature 98.4 F (36.9 C), temperature source Oral, height 5\' 1"  (1.549 m), weight 238 lb (108 kg), last menstrual period 12/24/2019, SpO2 100 %, currently breastfeeding. Body mass index is 44.97 kg/m.  General: Cooperative, alert, well developed, in no acute distress. HEENT: Conjunctivae and lids unremarkable. Cardiovascular: Regular rhythm.  Lungs: Normal work of breathing. Neurologic: No focal deficits.   Lab Results  Component Value Date   CREATININE 0.73 11/17/2019   BUN 13 11/17/2019   NA 138 11/17/2019   K 4.1 11/17/2019   CL 102 11/17/2019   CO2 23 11/17/2019   Lab Results  Component Value Date   ALT 18 11/17/2019   AST 19 11/17/2019   ALKPHOS 69 11/17/2019   BILITOT 0.5 11/17/2019   Lab Results  Component Value Date   HGBA1C 4.9 11/17/2019   Lab Results  Component Value Date   INSULIN 16.2 11/17/2019   Lab Results  Component Value Date   TSH 0.854 11/17/2019   Lab Results  Component Value Date   CHOL 167 11/17/2019   HDL 53 11/17/2019   LDLCALC 104 (H) 11/17/2019   TRIG 51 11/17/2019   Lab Results  Component Value Date   WBC 7.3 02/04/2020   HGB 11.1 (L) 02/04/2020   HCT 36.8 02/04/2020   MCV 78.0 (L) 02/04/2020   PLT 253 02/04/2020   Lab Results  Component Value Date   IRON 67 11/17/2019   TIBC 297 11/17/2019   FERRITIN 56 11/17/2019   Attestation Statements:   Reviewed by clinician on day of visit: allergies, medications, problem list, medical history, surgical history, family history, social history, and previous encounter notes.  I, 01/15/2020, CMA, am acting as Insurance claims handler for Energy manager, DO.  I have reviewed the above documentation for accuracy and completeness, and I agree with the above. [employer], DO

## 2020-02-07 ENCOUNTER — Other Ambulatory Visit: Payer: Self-pay | Admitting: Obstetrics and Gynecology

## 2020-02-07 ENCOUNTER — Encounter (INDEPENDENT_AMBULATORY_CARE_PROVIDER_SITE_OTHER): Payer: Self-pay | Admitting: Family Medicine

## 2020-02-07 LAB — COMPREHENSIVE METABOLIC PANEL
ALT: 15 IU/L (ref 0–32)
AST: 18 IU/L (ref 0–40)
Albumin/Globulin Ratio: 1.6 (ref 1.2–2.2)
Albumin: 4.1 g/dL (ref 3.9–5.0)
Alkaline Phosphatase: 68 IU/L (ref 39–117)
BUN/Creatinine Ratio: 15 (ref 9–23)
BUN: 10 mg/dL (ref 6–20)
Bilirubin Total: 0.4 mg/dL (ref 0.0–1.2)
CO2: 21 mmol/L (ref 20–29)
Calcium: 9 mg/dL (ref 8.7–10.2)
Chloride: 104 mmol/L (ref 96–106)
Creatinine, Ser: 0.68 mg/dL (ref 0.57–1.00)
GFR calc Af Amer: 140 mL/min/{1.73_m2} (ref >59)
GFR calc non Af Amer: 121 mL/min/{1.73_m2} (ref >59)
Globulin, Total: 2.6 g/dL (ref 1.5–4.5)
Glucose: 101 mg/dL — ABNORMAL HIGH (ref 65–99)
Potassium: 4.1 mmol/L (ref 3.5–5.2)
Sodium: 140 mmol/L (ref 134–144)
Total Protein: 6.7 g/dL (ref 6.0–8.5)

## 2020-02-07 LAB — CBC WITH DIFFERENTIAL/PLATELET
Basophils Absolute: 0 10*3/uL (ref 0.0–0.2)
Basos: 0 %
EOS (ABSOLUTE): 0.1 10*3/uL (ref 0.0–0.4)
Eos: 2 %
Hemoglobin: 11.1 g/dL (ref 11.1–15.9)
Immature Grans (Abs): 0 10*3/uL (ref 0.0–0.1)
Immature Granulocytes: 0 %
Lymphocytes Absolute: 1.6 10*3/uL (ref 0.7–3.1)
Lymphs: 26 %
MCH: 24.1 pg — ABNORMAL LOW (ref 26.6–33.0)
MCHC: 29.9 g/dL — ABNORMAL LOW (ref 31.5–35.7)
MCV: 81 fL (ref 79–97)
Monocytes Absolute: 0.4 10*3/uL (ref 0.1–0.9)
Monocytes: 6 %
Neutrophils Absolute: 4.1 10*3/uL (ref 1.4–7.0)
Neutrophils: 66 %
Platelets: 264 10*3/uL (ref 150–450)
RBC: 4.61 x10E6/uL (ref 3.77–5.28)
RDW: 12.7 % (ref 11.7–15.4)
WBC: 6.3 10*3/uL (ref 3.4–10.8)

## 2020-02-07 LAB — ANEMIA PANEL
Ferritin: 60 ng/mL (ref 15–150)
Folate, Hemolysate: 266 ng/mL
Folate, RBC: 717 ng/mL (ref >498)
Hematocrit: 37.1 % (ref 34.0–46.6)
Iron Saturation: 20 % (ref 15–55)
Iron: 60 ug/dL (ref 27–159)
Retic Ct Pct: 1.9 % (ref 0.6–2.6)
Total Iron Binding Capacity: 298 ug/dL (ref 250–450)
UIBC: 238 ug/dL (ref 131–425)
Vitamin B-12: 291 pg/mL (ref 232–1245)

## 2020-02-07 LAB — GC/CHLAMYDIA PROBE AMP (~~LOC~~) NOT AT ARMC
Chlamydia: NEGATIVE
Neisseria Gonorrhea: NEGATIVE

## 2020-02-07 NOTE — Telephone Encounter (Signed)
I have printed these out and placed on your desk.  Thank you.

## 2020-02-07 NOTE — Telephone Encounter (Signed)
Please review  Thank you

## 2020-02-08 ENCOUNTER — Other Ambulatory Visit: Payer: Self-pay | Admitting: Obstetrics and Gynecology

## 2020-02-14 LAB — BETA HCG QUANT (REF LAB): hCG Quant: 54 m[IU]/mL

## 2020-02-14 LAB — SPECIMEN STATUS REPORT

## 2020-02-27 ENCOUNTER — Encounter (INDEPENDENT_AMBULATORY_CARE_PROVIDER_SITE_OTHER): Payer: Self-pay | Admitting: Family Medicine

## 2020-02-27 ENCOUNTER — Ambulatory Visit (INDEPENDENT_AMBULATORY_CARE_PROVIDER_SITE_OTHER): Payer: 59 | Admitting: Family Medicine

## 2020-02-27 ENCOUNTER — Other Ambulatory Visit: Payer: Self-pay

## 2020-02-27 VITALS — BP 121/75 | HR 81 | Temp 98.5°F | Ht 61.0 in | Wt 247.0 lb

## 2020-02-27 DIAGNOSIS — O039 Complete or unspecified spontaneous abortion without complication: Secondary | ICD-10-CM

## 2020-02-27 DIAGNOSIS — D508 Other iron deficiency anemias: Secondary | ICD-10-CM

## 2020-02-27 DIAGNOSIS — E8881 Metabolic syndrome: Secondary | ICD-10-CM | POA: Diagnosis not present

## 2020-02-27 DIAGNOSIS — Z6841 Body Mass Index (BMI) 40.0 and over, adult: Secondary | ICD-10-CM

## 2020-02-27 DIAGNOSIS — Z9189 Other specified personal risk factors, not elsewhere classified: Secondary | ICD-10-CM

## 2020-02-27 NOTE — Progress Notes (Signed)
Office: [phone]  /  Fax: [phone]    Date: Mar 12, 2020   Appointment Start Time: 3:11pm Duration: 27 minutes Provider: Glennie Isle, Psy.D. Type of Session: Intake for Individual Therapy  Location of Patient: Home Location of Provider: Provider's Home Type of Contact: Telepsychological Visit via MyChart Video Visit  Informed Consent:  This provider called Taygen at 3:04pm as she did not present for the telepsychological appointment. A HIPAA compliant voicemail was left requesting a call back. She called back at 3:08pm, noting she can join. As such, today's appointment was initiated 11 minutes late. Prior to proceeding with today's appointment, two pieces of identifying information were obtained. In addition, Dabney's physical location at the time of this appointment was obtained as well a phone number she could be reached at in the event of technical difficulties. Dorsie and this provider participated in today's telepsychological service.   The provider's role was explained to [facility]. The provider reviewed and discussed issues of confidentiality, privacy, and limits therein (e.g., reporting obligations). In addition to verbal informed consent, written informed consent for psychological services was obtained prior to the initial appointment. Since the clinic is not a 24/7 crisis center, mental health emergency resources were shared and this  provider explained MyChart, e-mail, voicemail, and/or other messaging systems should be utilized only for non-emergency reasons. This provider also explained that information obtained during appointments will be placed in Esperanza's medical record and relevant information will be shared with other providers at [facility] for coordination of care. Moreover, Nelwyn agreed information may be shared with other [facility] providers as needed for coordination of care. By signing the service agreement document, Khloe  provided written consent for coordination of care. Prior to initiating telepsychological services, Camdyn completed an informed consent document, which included the development of a safety plan (i.e., an emergency contact, nearest emergency room, and emergency resources) in the event of an emergency/crisis. Laurenashley expressed understanding of the rationale of the safety plan. Julionna verbally acknowledged understanding she is ultimately responsible for understanding her insurance benefits for telepsychological and in-person services. This provider also reviewed confidentiality, as it relates to telepsychological services, as well as the rationale for telepsychological services (i.e., to reduce exposure risk to COVID-19). Emberlyn  acknowledged understanding that appointments cannot be recorded without both party consent and she is aware she is responsible for securing confidentiality on her end of the session. Yanil verbally consented to proceed.  Ebbie acknowledged understanding that for today's appointment and future telepsychological appointments, MyChart Video Visit will be utilized. She also verbally acknowledged understanding that the information outlined in the telepsychological informed consent form signed at the onset of treatment would still be applicable despite the change in the videoconferencing platform outlined in the consent form.   Chief Complaint/HPI: Tanith was referred by Dr. Briscoe Deutscher due to at risk for anxiety. Per the note for the visit with Dr. Briscoe Deutscher on February 27, 2020, "Princessa is at risk of developing anxiety due to stress, personal and or family history or current situation." The note for the initial appointment with Dr. Briscoe Deutscher (November 17, 2019) indicated the following: "Bettye's habits were reviewed today and are as follows: Her family eats meals together, she thinks her family might eat healthier with her, her desired weight loss is 54 pounds, she has been  heavy most of her life, she started gaining weight after she gave birth to her child, her heaviest weight ever was 250 pounds, she is  a picky eater and doesn't like to eat some healthy foods, she skips breakfast everyday and she is frequently drinking sweetened beverages.  Azalea feels that she makes poor food choices." Joanie's Food and Mood (modified PHQ-9) score on November 17, 2019 was 13.  During today's appointment, Chanta was verbally administered a questionnaire assessing various behaviors related to emotional eating. Geneviene endorsed the following: overeat when you are celebrating, eat certain foods when you are anxious, stressed, depressed, or your feelings are hurt, find food is comforting to you, overeat when you are worried about something and not worry about what you eat when you are in a good mood. She indicated an increase in emotional eating secondary to two recent losses (miscarriage [5 weeks ago] and aunt [February 16, 2020]), adding "I have support." Aside from the aforementioned, she described emotional eating as infrequent. She shared she craves candy. In addition, Shay denied a history of binge eating. Makaila denied a history of restricting food intake, purging and engagement in other compensatory strategies, and has never been diagnosed with an eating disorder. She also denied a history of treatment for emotional eating.   Mental Status Examination:  Appearance: well groomed and appropriate hygiene  Behavior: appropriate to circumstances Mood: euthymic Affect: mood congruent; tearful when discussing losses Speech: normal in rate, volume, and tone Eye Contact: appropriate Psychomotor Activity: appropriate Gait: unable to assess Thought Process: linear, logical, and goal directed  Thought Content/Perception: denies suicidal and homicidal ideation, plan, and intent and no hallucinations, delusions, bizarre thinking or behavior reported or observed Orientation: time, person, place  and purpose of appointment Memory/Concentration: memory, attention, language, and fund of knowledge intact  Insight/Judgment: good  Family & Psychosocial History: Pairlee reported she is in a relationship and she has a son (age 49). She indicated she is currently employed with [employer]. Additionally, Madgie shared her highest level of education obtained is a bachelor's degree. Currently, Kaylor's social support system consists of her mom, son, partner, brother, and sister. Moreover, Kady stated she resides with her son.   Medical History:  Past Medical History:  Diagnosis Date  . Anemia   . Eczema   . Gestational thrombocytopenia ([place]) 09/17/2013  . Obesity   . Postpartum care following cesarean delivery (11/7) 09/16/2013  . Seasonal allergies   . Shortness of breath    Past Surgical History:  Procedure Laterality Date  . CESAREAN SECTION N/A 09/16/2013   Procedure: CESAREAN SECTION;  Surgeon: Princess Bruins, MD;  Location: [facility];  Service: Obstetrics;  Laterality: N/A;  . TONSILLECTOMY     Current Outpatient Medications on File Prior to Visit  Medication Sig Dispense Refill  . metFORMIN (GLUCOPHAGE) 500 MG tablet Take 1 tablet (500 mg total) by mouth daily. 30 tablet 0  . Vitamin D, Ergocalciferol, (DRISDOL) 1.25 MG (50000 UNIT) CAPS capsule Take 1 capsule (50,000 Units total) by mouth every 7 (seven) days. 4 capsule 0   No current facility-administered medications on file prior to visit.  Avelyn denied a history of head injuries and loss of consciousness.    Mental Health History: Manon denied a history of therapeutic services. Kammie reported there is no history of hospitalizations for psychiatric concerns, and she has never met with a psychiatrist. Precious stated she has never been prescribed psychotropic medications. Rosia denied a family history of mental health related concerns. Hadiya reported there is no history of trauma including psychological, physical  and sexual  abuse, as well as neglect.   Otilia described her typical mood  lately as "really happy lately." She discussed occasional crying spells when thinking about recent losses. She denied her recent losses have impacted her daily, social, and occupational functioning. Jhaniya denied current alcohol use. She denied tobacco use. She denied illicit/recreational substance use. She denied current caffeine. Furthermore, Chace indicated she is not experiencing the following: hallucinations and delusions, paranoia, symptoms of mania , social withdrawal, panic attacks and decreased motivation. She also denied history of and current suicidal ideation, plan, and intent; history of and current homicidal ideation, plan, and intent; and history of and current engagement in self-harm.  The following strengths were reported by Jeyla: go getter, independent, leader, and good learner. The following strengths were observed by this provider: ability to express thoughts and feelings during the therapeutic session, ability to establish and benefit from a therapeutic relationship, willingness to work toward established goal(s) with the clinic and ability to engage in reciprocal conversation.  Legal History: Eimy reported there is no history of legal involvement.   Structured Assessments Results: The Patient Health Questionnaire-9 (PHQ-9) is a self-report measure that assesses symptoms and severity of depression over the course of the last two weeks. Ndea obtained a score of 0. [0= Not at all; 1= Several days; 2= More than half the days; 3= Nearly every day] Little interest or pleasure in doing things 0  Feeling down, depressed, or hopeless 0  Trouble falling or staying asleep, or sleeping too much 0  Feeling tired or having little energy 0  Poor appetite or overeating 0  Feeling bad about yourself --- or that you are a failure or have let yourself or your family down 0  Trouble concentrating on things, such as reading  the newspaper or watching television 0  Moving or speaking so slowly that other people could have noticed? Or the opposite --- being so fidgety or restless that you have been moving around a lot more than usual 0  Thoughts that you would be better off dead or hurting yourself in some way 0  PHQ-9 Score 0    The Generalized Anxiety Disorder-7 (GAD-7) is a brief self-report measure that assesses symptoms of anxiety over the course of the last two weeks. Tabbetha obtained a score of 0. [0= Not at all; 1= Several days; 2= Over half the days; 3= Nearly every day] Feeling nervous, anxious, on edge 0  Not being able to stop or control worrying 0  Worrying too much about different things 0  Trouble relaxing 0  Being so restless that it's hard to sit still 0  Becoming easily annoyed or irritable 0  Feeling afraid as if something awful might happen 0  GAD-7 Score 0   Interventions:  Conducted a chart review Focused on rapport building Verbally administered PHQ-9 and GAD-7 for symptom monitoring Verbally administered Food & Mood questionnaire to assess various behaviors related to emotional eating Provided emphatic reflections and validation Psychoeducation provided regarding physical versus emotional hunger  Provisional DSM-5 Diagnosis(es): 307.59 (F50.8) Other Specified Feeding or Eating Disorder, Emotional Eating Behaviors  Plan: This provider and Shadell discussed continuing therapeutic services; however, Naseem noted, "I feel good now." As such, Fawne declined future appointments with this provider. She acknowledged understanding that she may request a follow-up appointment with this provider in the future as long as she is still established with the clinic. Ariany will be sent a handout via e-mail to increase awareness of hunger patterns and subsequent eating. Sharonann provided verbal consent during today's appointment for this provider to send  the handout via e-mail. No further follow-up  planned by this provider.

## 2020-02-28 LAB — ANEMIA PANEL
Ferritin: 37 ng/mL (ref 15–150)
Folate, Hemolysate: 261 ng/mL
Folate, RBC: 705 ng/mL (ref >498)
Hematocrit: 37 % (ref 34.0–46.6)
Iron Saturation: 30 % (ref 15–55)
Iron: 82 ug/dL (ref 27–159)
Retic Ct Pct: 1.9 % (ref 0.6–2.6)
Total Iron Binding Capacity: 276 ug/dL (ref 250–450)
UIBC: 194 ug/dL (ref 131–425)
Vitamin B-12: 226 pg/mL — ABNORMAL LOW (ref 232–1245)

## 2020-02-28 LAB — CBC WITH DIFFERENTIAL/PLATELET
Basophils Absolute: 0 10*3/uL (ref 0.0–0.2)
Basos: 0 %
EOS (ABSOLUTE): 0.3 10*3/uL (ref 0.0–0.4)
Eos: 5 %
Hemoglobin: 10.9 g/dL — ABNORMAL LOW (ref 11.1–15.9)
Immature Grans (Abs): 0 10*3/uL (ref 0.0–0.1)
Immature Granulocytes: 0 %
Lymphocytes Absolute: 1.6 10*3/uL (ref 0.7–3.1)
Lymphs: 27 %
MCH: 23.9 pg — ABNORMAL LOW (ref 26.6–33.0)
MCHC: 29.5 g/dL — ABNORMAL LOW (ref 31.5–35.7)
MCV: 81 fL (ref 79–97)
Monocytes Absolute: 0.4 10*3/uL (ref 0.1–0.9)
Monocytes: 8 %
Neutrophils Absolute: 3.5 10*3/uL (ref 1.4–7.0)
Neutrophils: 60 %
Platelets: 245 10*3/uL (ref 150–450)
RBC: 4.56 x10E6/uL (ref 3.77–5.28)
RDW: 12.5 % (ref 11.7–15.4)
WBC: 5.9 10*3/uL (ref 3.4–10.8)

## 2020-02-28 LAB — BETA HCG QUANT (REF LAB): hCG Quant: <1 m[IU]/mL

## 2020-03-01 NOTE — Progress Notes (Signed)
Chief Complaint:   OBESITY Megan Ball is here to discuss her progress with her obesity treatment plan along with follow-up of her obesity related diagnoses. Megan Ball is on the Category 1 Plan and states she is following her eating plan approximately 80% of the time. Megan Ball states she is walking for 30 minutes 2 times per week.  Today's visit was #: 7 Starting weight: 243 lbs Starting date: 11/17/2019 Today's weight: 247 lbs Today's date: 03/01/2020 Total lbs lost to date: 0 Total lbs lost since last in-office visit: 0  Interim History: Camera recently suffered a miscarriage.  She also lost her aunt, with whom she was close.  She reports increased emotional eating.  Subjective:   1. Insulin resistance Megan Ball has a diagnosis of insulin resistance based on her elevated fasting insulin level >5. She continues to work on diet and exercise to decrease her risk of diabetes.  She is taking metformin 500 mg daily.  Lab Results  Component Value Date   INSULIN 16.2 11/17/2019   Lab Results  Component Value Date   HGBA1C 4.9 11/17/2019   2. Other iron deficiency anemia Megan Ball endorses increased fatigue.  She says her vaginal bleeding has stopped.  CBC Latest Ref Rng & Units 02/27/2020 02/06/2020 02/04/2020  WBC 3.4 - 10.8 x10E3/uL 5.9 6.3 7.3  Hemoglobin 11.1 - 15.9 g/dL 10.9(L) 11.1 11.1(L)  Hematocrit 34.0 - 46.6 % 37.0 37.1 36.8  Platelets 150 - 450 x10E3/uL 245 264 253   Lab Results  Component Value Date   IRON 82 02/27/2020   TIBC 276 02/27/2020   FERRITIN 37 02/27/2020   Lab Results  Component Value Date   VITAMINB12 226 (L) 02/27/2020   3. Miscarriage BHCG is still trending down.  4. At risk for anxiety Megan Ball is at risk of developing anxiety due to stress, personal and or family history or current situation.  Assessment/Plan:   1. Insulin resistance Megan Ball will continue to work on weight loss, exercise, and decreasing simple carbohydrates to help decrease the  risk of diabetes. Megan Ball agreed to follow-up with [name] as directed to closely monitor her progress.  2. Other iron deficiency anemia Orders and follow up as documented in patient record.  Counseling . Iron is essential for our bodies to make red blood cells.  Reasons that someone may be deficient include: an iron-deficient diet (more likely in those following vegan or vegetarian diets), women with heavy menses, patients with GI disorders or poor absorption, patients that have had bariatric surgery, frequent blood donors, patients with cancer, and patients with heart disease.   Megan Ball foods include dark leafy greens, red and white meats, eggs, seafood, and beans.   . Certain foods and drinks prevent your body from absorbing iron properly. Avoid eating these foods in the same meal as iron-rich foods or with iron supplements. These foods include: coffee, black tea, and red wine; milk, dairy products, and foods that are high in calcium; beans and soybeans; whole grains.  . Constipation can be a side effect of iron supplementation. Increased water and fiber intake are helpful. Water goal: > 2 liters/day. Fiber goal: > 25 grams/day.  Orders - Anemia panel - CBC with Differential/Platelet  3. Miscarriage Will recheck BHCG today.  Orders - Beta HCG, Quant  4. At risk for anxiety Megan Ball was given approximately 15 minutes of anxiety risk counseling today. She has risk factors for anxiety including recent miscarriage and a death in the family. We discussed the importance of a healthy  work life balance, a healthy relationship with food and a good support system.  Repetitive spaced learning was employed today to elicit superior memory formation and behavioral change.  5. Class 3 severe obesity with serious comorbidity and body mass index (BMI) of 45.0 to 49.9 in adult, unspecified obesity type (HCC) Megan Ball is currently in the action stage of change. As such, her goal is to continue with weight  loss efforts. She has agreed to the Category 1 Plan.   Exercise goals: For substantial health benefits, adults should do at least 150 minutes (2 hours and 30 minutes) a week of moderate-intensity, or 75 minutes (1 hour and 15 minutes) a week of vigorous-intensity aerobic physical activity, or an equivalent combination of moderate- and vigorous-intensity aerobic activity. Aerobic activity should be performed in episodes of at least 10 minutes, and preferably, it should be spread throughout the week.  Behavioral modification strategies: increasing water intake and emotional eating strategies.  Megan Ball has agreed to follow-up with our clinic in 2 weeks. She was informed of the importance of frequent follow-up visits to maximize her success with intensive lifestyle modifications for her multiple health conditions.   Megan Ball was informed we would discuss her lab results at her next visit unless there is a critical issue that needs to be addressed sooner. Megan Ball agreed to keep her next visit at the agreed upon time to discuss these results.  Objective:   Blood pressure 121/75, pulse 81, temperature 98.5 F (36.9 C), temperature source Oral, height 5\' 1"  (1.549 m), weight 247 lb (112 kg), last menstrual period 12/24/2019, SpO2 100 %, currently breastfeeding. Body mass index is 46.67 kg/m.  General: Cooperative, alert, well developed, in no acute distress. HEENT: Conjunctivae and lids unremarkable. Cardiovascular: Regular rhythm.  Lungs: Normal work of breathing. Neurologic: No focal deficits.   Lab Results  Component Value Date   CREATININE 0.68 02/06/2020   BUN 10 02/06/2020   NA 140 02/06/2020   K 4.1 02/06/2020   CL 104 02/06/2020   CO2 21 02/06/2020   Lab Results  Component Value Date   ALT 15 02/06/2020   AST 18 02/06/2020   ALKPHOS 68 02/06/2020   BILITOT 0.4 02/06/2020   Lab Results  Component Value Date   HGBA1C 4.9 11/17/2019   Lab Results  Component Value Date    INSULIN 16.2 11/17/2019   Lab Results  Component Value Date   TSH 0.854 11/17/2019   Lab Results  Component Value Date   CHOL 167 11/17/2019   HDL 53 11/17/2019   LDLCALC 104 (H) 11/17/2019   TRIG 51 11/17/2019   Lab Results  Component Value Date   WBC 5.9 02/27/2020   HGB 10.9 (L) 02/27/2020   HCT 37.0 02/27/2020   MCV 81 02/27/2020   PLT 245 02/27/2020   Lab Results  Component Value Date   IRON 82 02/27/2020   TIBC 276 02/27/2020   FERRITIN 37 02/27/2020   Attestation Statements:   Reviewed by clinician on day of visit: allergies, medications, problem list, medical history, surgical history, family history, social history, and previous encounter notes.  I, 02/29/2020, CMA, am acting as Insurance claims handler for Energy manager, DO.  I have reviewed the above documentation for accuracy and completeness, and I agree with the above. [employer], DO

## 2020-03-12 ENCOUNTER — Telehealth (INDEPENDENT_AMBULATORY_CARE_PROVIDER_SITE_OTHER): Payer: 59 | Admitting: Psychology

## 2020-03-12 ENCOUNTER — Encounter (INDEPENDENT_AMBULATORY_CARE_PROVIDER_SITE_OTHER): Payer: Self-pay | Admitting: Psychology

## 2020-03-12 DIAGNOSIS — F5089 Other specified eating disorder: Secondary | ICD-10-CM

## 2020-03-19 ENCOUNTER — Encounter (INDEPENDENT_AMBULATORY_CARE_PROVIDER_SITE_OTHER): Payer: Self-pay | Admitting: Family Medicine

## 2020-03-19 ENCOUNTER — Ambulatory Visit (INDEPENDENT_AMBULATORY_CARE_PROVIDER_SITE_OTHER): Payer: 59 | Admitting: Family Medicine

## 2020-03-19 ENCOUNTER — Other Ambulatory Visit: Payer: Self-pay

## 2020-03-19 VITALS — BP 108/71 | HR 93 | Temp 98.3°F | Ht 61.0 in | Wt 241.0 lb

## 2020-03-19 DIAGNOSIS — E559 Vitamin D deficiency, unspecified: Secondary | ICD-10-CM | POA: Diagnosis not present

## 2020-03-19 DIAGNOSIS — F3289 Other specified depressive episodes: Secondary | ICD-10-CM | POA: Diagnosis not present

## 2020-03-19 DIAGNOSIS — D508 Other iron deficiency anemias: Secondary | ICD-10-CM

## 2020-03-19 DIAGNOSIS — Z9189 Other specified personal risk factors, not elsewhere classified: Secondary | ICD-10-CM | POA: Diagnosis not present

## 2020-03-19 DIAGNOSIS — Z6841 Body Mass Index (BMI) 40.0 and over, adult: Secondary | ICD-10-CM

## 2020-03-19 MED ORDER — VITAMIN D (ERGOCALCIFEROL) 1.25 MG (50000 UNIT) PO CAPS: 50000.0000 [IU] | ORAL_CAPSULE | ORAL | 0 refills | Status: DC

## 2020-03-19 NOTE — Progress Notes (Signed)
Chief Complaint:   OBESITY Megan Ball is here to discuss her progress with her obesity treatment plan along with follow-up of her obesity related diagnoses. Megan Ball is on the Category 1 Plan and states she is following her eating plan approximately 85% of the time. Megan Ball states she is walking for 30 minutes 2-3 times per week.  Today's visit was #: 8 Starting weight: 243 lbs Starting date: 11/17/2019 Today's weight: 241 lbs Today's date: 03/19/2020 Total lbs lost to date: 2 lbs Total lbs lost since last in-office visit: 6 lbs  Interim History: Megan Ball says she has been getting out to walk a lot.  She is enjoying time with the kids.  Subjective:   1. Vitamin D deficiency Megan Ball's Vitamin D level was 11.2 on 11/17/2019. She is currently taking prescription vitamin D 50,000 IU each week. She denies nausea, vomiting or muscle weakness.  2. Other iron deficiency anemia CBC Latest Ref Rng & Units 02/27/2020 02/06/2020 02/04/2020  WBC 3.4 - 10.8 x10E3/uL 5.9 6.3 7.3  Hemoglobin 11.1 - 15.9 g/dL 10.9(L) 11.1 11.1(L)  Hematocrit 34.0 - 46.6 % 37.0 37.1 36.8  Platelets 150 - 450 x10E3/uL 245 264 253   Lab Results  Component Value Date   IRON 82 02/27/2020   TIBC 276 02/27/2020   FERRITIN 37 02/27/2020   Lab Results  Component Value Date   VITAMINB12 226 (L) 02/27/2020   3. Other depression, with emotional eating  Megan Ball feels the visit with Dr. Mallie Mussel was helpful.  4. At risk for constipation Megan Ball is at increased risk for constipation due to inadequate water intake, changes in diet, and/or use of medications such as GLP1 agonists. Megan Ball denies hard, infrequent stools currently.   Assessment/Plan:   1. Vitamin D deficiency Low Vitamin D level contributes to fatigue and are associated with obesity, breast, and colon cancer. She agrees to continue to take prescription Vitamin D @50 ,000 IU every week and will follow-up for routine testing of Vitamin D, at least 2-3 times per  year to avoid over-replacement.   Orders - Vitamin D, Ergocalciferol, (DRISDOL) 1.25 MG (50000 UNIT) CAPS capsule; Take 1 capsule (50,000 Units total) by mouth every 7 (seven) days.  Dispense: 4 capsule; Refill: 0  2. Other iron deficiency anemia Orders and follow up as documented in patient record.  Recommend OTC slow-release iron supplement.  Counseling . Iron is essential for our bodies to make red blood cells.  Reasons that someone may be deficient include: an iron-deficient diet (more likely in those following vegan or vegetarian diets), women with heavy menses, patients with GI disorders or poor absorption, patients that have had bariatric surgery, frequent blood donors, patients with cancer, and patients with heart disease.   Megan Ball foods include dark leafy greens, red and white meats, eggs, seafood, and beans.   . Certain foods and drinks prevent your body from absorbing iron properly. Avoid eating these foods in the same meal as iron-rich foods or with iron supplements. These foods include: coffee, black tea, and red wine; milk, dairy products, and foods that are high in calcium; beans and soybeans; whole grains.  . Constipation can be a side effect of iron supplementation. Increased water and fiber intake are helpful. Water goal: > 2 liters/day. Fiber goal: > 25 grams/day.  3. Other depression, with emotional eating  Behavior modification techniques were discussed today to help Megan Ball deal with her emotional/non-hunger eating behaviors.  Orders and follow up as documented in patient record.   4. At  risk for constipation Megan Ball was given approximately 15 minutes of counseling today regarding prevention of constipation. She was encouraged to increase water and fiber intake.   5. Class 3 severe obesity with serious comorbidity and body mass index (BMI) of 45.0 to 49.9 in adult, unspecified obesity type (HCC) Megan Ball is currently in the action stage of change. As such, her goal is  to continue with weight loss efforts. She has agreed to the Category 1 Plan.   Exercise goals: For substantial health benefits, adults should do at least 150 minutes (2 hours and 30 minutes) a week of moderate-intensity, or 75 minutes (1 hour and 15 minutes) a week of vigorous-intensity aerobic physical activity, or an equivalent combination of moderate- and vigorous-intensity aerobic activity. Aerobic activity should be performed in episodes of at least 10 minutes, and preferably, it should be spread throughout the week.  Behavioral modification strategies: increasing lean protein intake and increasing water intake.  Megan Ball has agreed to follow-up with our clinic in 3 weeks. She was informed of the importance of frequent follow-up visits to maximize her success with intensive lifestyle modifications for her multiple health conditions.   Objective:   Blood pressure 108/71, pulse 93, temperature 98.3 F (36.8 C), temperature source Oral, height 5\' 1"  (1.549 m), weight 241 lb (109.3 kg), last menstrual period 12/24/2019, SpO2 99 %, currently breastfeeding. Body mass index is 45.54 kg/m.  General: Cooperative, alert, well developed, in no acute distress. HEENT: Conjunctivae and lids unremarkable. Cardiovascular: Regular rhythm.  Lungs: Normal work of breathing. Neurologic: No focal deficits.   Lab Results  Component Value Date   CREATININE 0.68 02/06/2020   BUN 10 02/06/2020   NA 140 02/06/2020   K 4.1 02/06/2020   CL 104 02/06/2020   CO2 21 02/06/2020   Lab Results  Component Value Date   ALT 15 02/06/2020   AST 18 02/06/2020   ALKPHOS 68 02/06/2020   BILITOT 0.4 02/06/2020   Lab Results  Component Value Date   HGBA1C 4.9 11/17/2019   Lab Results  Component Value Date   INSULIN 16.2 11/17/2019   Lab Results  Component Value Date   TSH 0.854 11/17/2019   Lab Results  Component Value Date   CHOL 167 11/17/2019   HDL 53 11/17/2019   LDLCALC 104 (H) 11/17/2019   TRIG  51 11/17/2019   Lab Results  Component Value Date   WBC 5.9 02/27/2020   HGB 10.9 (L) 02/27/2020   HCT 37.0 02/27/2020   MCV 81 02/27/2020   PLT 245 02/27/2020   Lab Results  Component Value Date   IRON 82 02/27/2020   TIBC 276 02/27/2020   FERRITIN 37 02/27/2020   Attestation Statements:   Reviewed by clinician on day of visit: allergies, medications, problem list, medical history, surgical history, family history, social history, and previous encounter notes.  I, 02/29/2020, CMA, am acting as Insurance claims handler for Energy manager, DO.  I have reviewed the above documentation for accuracy and completeness, and I agree with the above. [employer], DO

## 2020-03-23 ENCOUNTER — Encounter (INDEPENDENT_AMBULATORY_CARE_PROVIDER_SITE_OTHER): Payer: Self-pay | Admitting: Family Medicine

## 2020-03-26 NOTE — Telephone Encounter (Signed)
Please advise 

## 2020-03-27 ENCOUNTER — Encounter (INDEPENDENT_AMBULATORY_CARE_PROVIDER_SITE_OTHER): Payer: Self-pay | Admitting: *Deleted

## 2020-04-16 ENCOUNTER — Other Ambulatory Visit: Payer: Self-pay

## 2020-04-16 ENCOUNTER — Ambulatory Visit (INDEPENDENT_AMBULATORY_CARE_PROVIDER_SITE_OTHER): Payer: Managed Care, Other (non HMO) | Admitting: Family Medicine

## 2020-04-16 ENCOUNTER — Encounter (INDEPENDENT_AMBULATORY_CARE_PROVIDER_SITE_OTHER): Payer: Self-pay | Admitting: Family Medicine

## 2020-04-16 VITALS — BP 134/79 | HR 82 | Temp 98.1°F | Ht 61.0 in | Wt 242.0 lb

## 2020-04-16 DIAGNOSIS — E559 Vitamin D deficiency, unspecified: Secondary | ICD-10-CM | POA: Diagnosis not present

## 2020-04-16 DIAGNOSIS — D509 Iron deficiency anemia, unspecified: Secondary | ICD-10-CM | POA: Diagnosis not present

## 2020-04-16 DIAGNOSIS — E8881 Metabolic syndrome: Secondary | ICD-10-CM

## 2020-04-16 DIAGNOSIS — Z6841 Body Mass Index (BMI) 40.0 and over, adult: Secondary | ICD-10-CM

## 2020-04-16 DIAGNOSIS — E538 Deficiency of other specified B group vitamins: Secondary | ICD-10-CM | POA: Diagnosis not present

## 2020-04-16 DIAGNOSIS — R110 Nausea: Secondary | ICD-10-CM

## 2020-04-16 DIAGNOSIS — E66813 Obesity, class 3: Secondary | ICD-10-CM

## 2020-04-16 DIAGNOSIS — E88819 Insulin resistance, unspecified: Secondary | ICD-10-CM

## 2020-04-16 MED ORDER — METFORMIN HCL 500 MG PO TABS: 500.0000 mg | ORAL_TABLET | Freq: Every day | ORAL | 0 refills | Status: DC

## 2020-04-16 MED ORDER — DOXYLAMINE-PYRIDOXINE 10-10 MG PO TBEC: DELAYED_RELEASE_TABLET | ORAL | 0 refills | Status: DC

## 2020-04-16 NOTE — Progress Notes (Signed)
Chief Complaint:   OBESITY Megan Ball is here to discuss her progress with her obesity treatment plan along with follow-up of her obesity related diagnoses. Megan Ball is on the Category 1 Plan and states she is following her eating plan approximately 100% of the time. Megan Ball states she is walking for 30 minutes 2-3 times per week.  Today's visit was #: 9 Starting weight: 243 lbs Starting date: 11/17/2019 Today's weight: 242 lbs Today's date: 04/16/2020 Total lbs lost to date: 1 lb Total lbs lost since last in-office visit: 0  Interim History: Megan Ball has a positive urine pregnancy test.  She had blood work on 04/12/2020.  Her LMP was 02/11/2020, so she is 5 weeks, 3 days, due 12/14/2020.  Her next appointment is 05/07/2020 with Dr. Garwin Ball.  She endorses nausea and breast tenderness.  She is taking a prenatal vitamin.  Subjective:   1. Insulin resistance Megan Ball has a diagnosis of insulin resistance based on her elevated fasting insulin level >5. She continues to work on diet and exercise to decrease her risk of diabetes.  Lab Results  Component Value Date   INSULIN 16.2 11/17/2019   Lab Results  Component Value Date   HGBA1C 4.9 11/17/2019   2. B12 deficiency She is not a vegetarian.  She does not have a previous diagnosis of pernicious anemia.  She does not have a history of weight loss surgery.   Lab Results  Component Value Date   VITAMINB12 226 (L) 02/27/2020   3. Microcytic anemia Megan Ball is not a vegetarian.  She does not have a history of weight loss surgery.   CBC Latest Ref Rng & Units 02/27/2020 02/06/2020 02/04/2020  WBC 3.4 - 10.8 x10E3/uL 5.9 6.3 7.3  Hemoglobin 11.1 - 15.9 g/dL 10.9(L) 11.1 11.1(L)  Hematocrit 34.0 - 46.6 % 37.0 37.1 36.8  Platelets 150 - 450 x10E3/uL 245 264 253   Lab Results  Component Value Date   IRON 82 02/27/2020   TIBC 276 02/27/2020   FERRITIN 37 02/27/2020   Lab Results  Component Value Date   VITAMINB12 226 (L) 02/27/2020   4.  Vitamin D deficiency Megan Ball's Vitamin D level was 11.2 on 11/17/2019. She is currently taking prescription vitamin D 50,000 IU each week. She denies nausea, vomiting or muscle weakness.  5. Nausea Megan Ball endorses nausea.  Assessment/Plan:   1. Insulin resistance Megan Ball will continue to work on weight loss, exercise, and decreasing simple carbohydrates to help decrease the risk of diabetes. Megan Ball agreed to follow-up with [name] as directed to closely monitor her progress.  Orders - metFORMIN (GLUCOPHAGE) 500 MG tablet; Take 1 tablet (500 mg total) by mouth daily.  Dispense: 30 tablet; Refill: 0  2. B12 deficiency The diagnosis was reviewed with the patient. Megan Ball is taking a prenatal vitamin.  Counseling provided today, see below. We will continue to monitor. Orders and follow up as documented in patient record.  Counseling . The body needs vitamin B12: to make red blood cells; to make DNA; and to help the nerves work properly so they can carry messages from the brain to the body.  . The main causes of vitamin B12 deficiency include dietary deficiency, digestive diseases, pernicious anemia, and having a surgery in which part of the stomach or small intestine is removed.  . Certain medicines can make it harder for the body to absorb vitamin B12. These medicines include: heartburn medications; some antibiotics; some medications used to treat diabetes, gout, and high cholesterol.  . In  some cases, there are no symptoms of this condition. If the condition leads to anemia or nerve damage, various symptoms can occur, such as weakness or fatigue, shortness of breath, and numbness or tingling in your hands and feet.   . Treatment:  o May include taking vitamin B12 supplements.  o Avoid alcohol.  o Eat lots of healthy foods that contain vitamin B12: - Beef, pork, chicken, [place], and organ meats, such as liver.  - Seafood: This includes clams, rainbow trout, salmon, tuna, and haddock. Eggs.   - Cereal and dairy products that are fortified: This means that vitamin B12 has been added to the food.   3. Microcytic anemia Megan Ball is taking a prenatal vitamin with iron.  Orders and follow up as documented in patient record.  Counseling . Iron is essential for our bodies to make red blood cells.  Reasons that someone may be deficient include: an iron-deficient diet (more likely in those following vegan or vegetarian diets), women with heavy menses, patients with GI disorders or poor absorption, patients that have had bariatric surgery, frequent blood donors, patients with cancer, and patients with heart disease.   Megan Ball foods include dark leafy greens, red and white meats, eggs, seafood, and beans.   . Certain foods and drinks prevent your body from absorbing iron properly. Avoid eating these foods in the same meal as iron-rich foods or with iron supplements. These foods include: coffee, black tea, and red wine; milk, dairy products, and foods that are high in calcium; beans and soybeans; whole grains.  . Constipation can be a side effect of iron supplementation. Increased water and fiber intake are helpful. Water goal: > 2 liters/day. Fiber goal: > 25 grams/day.  4. Vitamin D deficiency She is going to stop taking vitamin D and continue taking her prenatal vitamin.  5. Nausea Will send in Megan Ball for Megan Ball's nausea.  Orders - Doxylamine-Pyridoxine 10-10 MG TBEC; Take 1-2 tablets twice daily as needed for nausea  Dispense: 30 tablet; Refill: 0  6. Class 3 severe obesity with serious comorbidity and body mass index (BMI) of 45.0 to 49.9 in adult, unspecified obesity type (HCC) Megan Ball is currently in the action stage of change. As such, her goal is to continue with weight loss efforts. She has agreed to practicing portion control and making smarter food choices, such as increasing vegetables and decreasing simple carbohydrates.   Megan Ball will also increase her fiber and water  intake.  Exercise goals: For substantial health benefits, adults should do at least 150 minutes (2 hours and 30 minutes) a week of moderate-intensity, or 75 minutes (1 hour and 15 minutes) a week of vigorous-intensity aerobic physical activity, or an equivalent combination of moderate- and vigorous-intensity aerobic activity. Aerobic activity should be performed in episodes of at least 10 minutes, and preferably, it should be spread throughout the week.  Behavioral modification strategies: increasing water intake and increasing high fiber foods.  Megan Ball has agreed to follow-up with our clinic in 2 months. She was informed of the importance of frequent follow-up visits to maximize her success with intensive lifestyle modifications for her multiple health conditions.   Objective:   Blood pressure 134/79, pulse 82, temperature 98.1 F (36.7 C), temperature source Oral, height 5\' 1"  (1.549 m), weight 242 lb (109.8 kg), last menstrual period 12/24/2019, SpO2 97 %, currently breastfeeding. Body mass index is 45.73 kg/m.  General: Cooperative, alert, well developed, in no acute distress. HEENT: Conjunctivae and lids unremarkable. Cardiovascular: Regular rhythm.  Lungs:  Normal work of breathing. Neurologic: No focal deficits.   Lab Results  Component Value Date   CREATININE 0.68 02/06/2020   BUN 10 02/06/2020   NA 140 02/06/2020   K 4.1 02/06/2020   CL 104 02/06/2020   CO2 21 02/06/2020   Lab Results  Component Value Date   ALT 15 02/06/2020   AST 18 02/06/2020   ALKPHOS 68 02/06/2020   BILITOT 0.4 02/06/2020   Lab Results  Component Value Date   HGBA1C 4.9 11/17/2019   Lab Results  Component Value Date   INSULIN 16.2 11/17/2019   Lab Results  Component Value Date   TSH 0.854 11/17/2019   Lab Results  Component Value Date   CHOL 167 11/17/2019   HDL 53 11/17/2019   LDLCALC 104 (H) 11/17/2019   TRIG 51 11/17/2019   Lab Results  Component Value Date   WBC 5.9  02/27/2020   HGB 10.9 (L) 02/27/2020   HCT 37.0 02/27/2020   MCV 81 02/27/2020   PLT 245 02/27/2020   Lab Results  Component Value Date   IRON 82 02/27/2020   TIBC 276 02/27/2020   FERRITIN 37 02/27/2020   Attestation Statements:   Reviewed by clinician on day of visit: allergies, medications, problem list, medical history, surgical history, family history, social history, and previous encounter notes.  I, Insurance claims handler, CMA, am acting as transcriptionist for Helane Rima, DO  I have reviewed the above documentation for accuracy and completeness, and I agree with the above. Helane Rima, DO

## 2020-06-18 ENCOUNTER — Other Ambulatory Visit: Payer: Self-pay

## 2020-06-18 ENCOUNTER — Ambulatory Visit (INDEPENDENT_AMBULATORY_CARE_PROVIDER_SITE_OTHER): Payer: Managed Care, Other (non HMO) | Admitting: Family Medicine

## 2020-06-18 ENCOUNTER — Encounter (INDEPENDENT_AMBULATORY_CARE_PROVIDER_SITE_OTHER): Payer: Self-pay | Admitting: Family Medicine

## 2020-06-18 VITALS — BP 138/69 | HR 95 | Temp 98.2°F | Ht 61.0 in | Wt 250.0 lb

## 2020-06-18 DIAGNOSIS — Z6841 Body Mass Index (BMI) 40.0 and over, adult: Secondary | ICD-10-CM

## 2020-06-18 DIAGNOSIS — Z9189 Other specified personal risk factors, not elsewhere classified: Secondary | ICD-10-CM

## 2020-06-18 DIAGNOSIS — R110 Nausea: Secondary | ICD-10-CM | POA: Diagnosis not present

## 2020-06-18 DIAGNOSIS — Z3A14 14 weeks gestation of pregnancy: Secondary | ICD-10-CM

## 2020-06-18 NOTE — Progress Notes (Signed)
Chief Complaint:   OBESITY Megan Ball is here to discuss her progress with her obesity treatment plan along with follow-up of her obesity related diagnoses. Megan Ball is on practicing portion control and making smarter food choices, such as increasing vegetables and decreasing simple carbohydrates and states she is following her eating plan approximately 50% of the time. Megan Ball states she is walking for 20 minutes 2 times per week.  Today's visit was #: 10 Starting weight: 243 lbs Starting date: 11/17/2019 Today's weight: 250 lbs Today's date: 06/18/2020  Interim History: Megan Ball sees [employer].  She is [redacted] weeks pregnant and doing well.  She is still having nausea each night with some vomiting.  She is taking a prenatal vitamin.  Subjective:   1. Nausea Possible reflux.  2. Pregnancy with 14 completed weeks gestation Discussed nutrition and weight expectation.  3. At risk for activity intolerance Megan Ball is at risk for exercise intolerance due to pregnancy.  Assessment/Plan:   1. Nausea Start Pepcid and eat smaller meals.  2. Pregnancy with 14 completed weeks gestation Continue prenatal vitamin.  3. At risk for activity intolerance Megan Ball was given approximately 15 minutes of exercise intolerance counseling today. She is 29 y.o. female and has risk factors exercise intolerance including obesity. We discussed intensive lifestyle modifications today with an emphasis on specific weight loss instructions and strategies. Megan Ball will slowly increase activity as tolerated.  Repetitive spaced learning was employed today to elicit superior memory formation and behavioral change.  4. Class 3 severe obesity with serious comorbidity and body mass index (BMI) of 45.0 to 49.9 in adult, unspecified obesity type (HCC) Megan Ball is currently in the action stage of change. As such, her goal is to continue with weight loss efforts. She has agreed to practicing portion control and making  smarter food choices, such as increasing vegetables and decreasing simple carbohydrates.   Exercise goals: For substantial health benefits, adults should do at least 150 minutes (2 hours and 30 minutes) a week of moderate-intensity, or 75 minutes (1 hour and 15 minutes) a week of vigorous-intensity aerobic physical activity, or an equivalent combination of moderate- and vigorous-intensity aerobic activity. Aerobic activity should be performed in episodes of at least 10 minutes, and preferably, it should be spread throughout the week.  Behavioral modification strategies: increasing lean protein intake and increasing water intake.  Megan Ball has agreed to follow-up with our clinic in 10 weeks. She was informed of the importance of frequent follow-up visits to maximize her success with intensive lifestyle modifications for her multiple health conditions.   Objective:   Blood pressure 138/69, pulse 95, temperature 98.2 F (36.8 C), temperature source Oral, height 5\' 1"  (1.549 m), weight 250 lb (113.4 kg), last menstrual period 12/24/2019, SpO2 97 %, currently breastfeeding. Body mass index is 47.24 kg/m.  General: Cooperative, alert, well developed, in no acute distress. HEENT: Conjunctivae and lids unremarkable. Cardiovascular: Regular rhythm.  Lungs: Normal work of breathing. Neurologic: No focal deficits.   Lab Results  Component Value Date   CREATININE 0.68 02/06/2020   BUN 10 02/06/2020   NA 140 02/06/2020   K 4.1 02/06/2020   CL 104 02/06/2020   CO2 21 02/06/2020   Lab Results  Component Value Date   ALT 15 02/06/2020   AST 18 02/06/2020   ALKPHOS 68 02/06/2020   BILITOT 0.4 02/06/2020   Lab Results  Component Value Date   HGBA1C 4.9 11/17/2019   Lab Results  Component Value Date   INSULIN 16.2  11/17/2019   Lab Results  Component Value Date   TSH 0.854 11/17/2019   Lab Results  Component Value Date   CHOL 167 11/17/2019   HDL 53 11/17/2019   LDLCALC 104 (H)  11/17/2019   TRIG 51 11/17/2019   Lab Results  Component Value Date   WBC 5.9 02/27/2020   HGB 10.9 (L) 02/27/2020   HCT 37.0 02/27/2020   MCV 81 02/27/2020   PLT 245 02/27/2020   Lab Results  Component Value Date   IRON 82 02/27/2020   TIBC 276 02/27/2020   FERRITIN 37 02/27/2020   Attestation Statements:   Reviewed by clinician on day of visit: allergies, medications, problem list, medical history, surgical history, family history, social history, and previous encounter notes.  I, Insurance claims handler, CMA, am acting as transcriptionist for Helane Rima, DO  I have reviewed the above documentation for accuracy and completeness, and I agree with the above. Helane Rima, DO

## 2020-06-20 LAB — OB RESULTS CONSOLE HIV ANTIBODY (ROUTINE TESTING): HIV: NONREACTIVE

## 2020-06-20 LAB — OB RESULTS CONSOLE RUBELLA ANTIBODY, IGM: Rubella: IMMUNE

## 2020-06-20 LAB — OB RESULTS CONSOLE HEPATITIS B SURFACE ANTIGEN: Hepatitis B Surface Ag: NEGATIVE

## 2020-07-08 ENCOUNTER — Other Ambulatory Visit: Payer: Self-pay

## 2020-07-08 ENCOUNTER — Inpatient Hospital Stay (HOSPITAL_COMMUNITY)
Admission: AD | Admit: 2020-07-08 | Discharge: 2020-07-08 | Disposition: A | Discharge status: Home / Self Care | Payer: Managed Care, Other (non HMO) | Attending: Obstetrics & Gynecology | Admitting: Obstetrics & Gynecology

## 2020-07-08 ENCOUNTER — Inpatient Hospital Stay (HOSPITAL_COMMUNITY): Payer: Managed Care, Other (non HMO)

## 2020-07-08 ENCOUNTER — Encounter (HOSPITAL_COMMUNITY): Payer: Self-pay | Admitting: Obstetrics & Gynecology

## 2020-07-08 DIAGNOSIS — O99891 Other specified diseases and conditions complicating pregnancy: Secondary | ICD-10-CM | POA: Insufficient documentation

## 2020-07-08 DIAGNOSIS — Z3A17 17 weeks gestation of pregnancy: Secondary | ICD-10-CM | POA: Insufficient documentation

## 2020-07-08 DIAGNOSIS — O26892 Other specified pregnancy related conditions, second trimester: Secondary | ICD-10-CM

## 2020-07-08 DIAGNOSIS — R55 Syncope and collapse: Secondary | ICD-10-CM | POA: Diagnosis not present

## 2020-07-08 DIAGNOSIS — R519 Headache, unspecified: Secondary | ICD-10-CM

## 2020-07-08 DIAGNOSIS — E669 Obesity, unspecified: Secondary | ICD-10-CM | POA: Insufficient documentation

## 2020-07-08 DIAGNOSIS — O4693 Antepartum hemorrhage, unspecified, third trimester: Secondary | ICD-10-CM

## 2020-07-08 DIAGNOSIS — O99212 Obesity complicating pregnancy, second trimester: Secondary | ICD-10-CM | POA: Insufficient documentation

## 2020-07-08 LAB — URINALYSIS, ROUTINE W REFLEX MICROSCOPIC
Bilirubin Urine: NEGATIVE
Glucose, UA: NEGATIVE mg/dL
Hgb urine dipstick: NEGATIVE
Ketones, ur: 15 mg/dL — AB
Leukocytes,Ua: NEGATIVE
Nitrite: NEGATIVE
Protein, ur: NEGATIVE mg/dL
Specific Gravity, Urine: 1.02 (ref 1.005–1.030)
pH: 8 (ref 5.0–8.0)

## 2020-07-08 MED ORDER — ACETAMINOPHEN 325 MG PO TABS
650.0000 mg | ORAL_TABLET | Freq: Once | ORAL | Status: AC
  Administered 2020-07-08: 650 mg via ORAL
  Filled 2020-07-08: qty 2

## 2020-07-08 NOTE — MAU Note (Signed)
Megan Ball is a 29 y.o. at [redacted]w[redacted]d here in MAU reporting: states [place] she [place] fle[place] and then started walking and states her mom reports that she passed out. Now has a headache. States she does not remember being dizzy. Has had a banana and chips today, had 2-3 cups of water and a gatorade.  Onset of complaint: today  Pain score: 6/10  Vitals:   07/08/20 1245  BP: 116/72  Pulse: (!) 102  Resp: 18  Temp: 98.5 F (36.9 C)  SpO2: 99%     FHT: 152  Lab orders placed from triage: UA

## 2020-07-08 NOTE — MAU Provider Note (Signed)
History     CSN: [id number]  Arrival date and time: 07/08/20 1224   First Provider Initiated Contact with Patient 07/08/20 1302      Chief Complaint  Patient presents with  . Headache  . Loss of Consciousness   HPI Megan Ball 29 y.o. [redacted]w[redacted]d  Came to MAU today for evaluation after passing out when she was out with her mother today.  Fell onto her right knee, right arm and onto her right side.  Mother reports she did not hit her head.  Clent does not think she hit her head.  Has had headaches prior to pregnancy and since being pregnant.  Had a headache after sitting up as the syncope passed.  She did have loss of hearing and a feeling of being dizzy just before she passed out.  Has had lots of fluids to drink today and Gatorade since she passed out.  Is feeling well now except for the headache and some knee pain.  Diet recall had a banana and chips to eat today and is now hungry.   OB History    Gravida  3   Para  1   Term  1   Preterm      AB  1   Living  1     SAB  1   TAB      Ectopic      Multiple      Live Births  1           Past Medical History:  Diagnosis Date  . Anemia   . Eczema   . Gestational thrombocytopenia (HCC) 09/17/2013  . Obesity   . Postpartum care following cesarean delivery (11/7) 09/16/2013  . Seasonal allergies   . Shortness of breath     Past Surgical History:  Procedure Laterality Date  . CESAREAN SECTION N/A 09/16/2013   Procedure: CESAREAN SECTION;  Surgeon: Genia Del, MD;  Location: [facility];  Service: Obstetrics;  Laterality: N/A;  . TONSILLECTOMY      Family History  Problem Relation Age of Onset  . Obesity Mother   . High blood pressure Father   . Obesity Father   . Hypertension Maternal Grandmother   . Cancer Maternal Grandfather        Throat Cancer    Social History   Tobacco Use  . Smoking status: Never Smoker  . Smokeless tobacco: Never Used  Substance Use Topics  . Alcohol use: Not Currently     Comment: occ  . Drug use: No    Allergies: No Known Allergies  Medications Prior to Admission  Medication Sig Dispense Refill Last Dose  . Doxylamine-Pyridoxine 10-10 MG TBEC Take 1-2 tablets twice daily as needed for nausea (Patient not taking: Reported on 06/18/2020) 30 tablet 0   . metFORMIN (GLUCOPHAGE) 500 MG tablet Take 1 tablet (500 mg total) by mouth daily. (Patient not taking: Reported on 06/18/2020) 30 tablet 0     Review of Systems  Constitutional: Negative for fever.  Gastrointestinal: Negative for abdominal pain, nausea and vomiting.  Genitourinary: Negative for dysuria, vaginal bleeding and vaginal discharge.  Musculoskeletal: Negative for back pain.  Skin:       Right knee pain and skin on right knee not broken but more rough than left  Neurological: Positive for dizziness, syncope and headaches. Negative for seizures and facial asymmetry.   Physical Exam   Blood pressure 116/72, pulse (!) 102, temperature 98.5 F (36.9 C), temperature source Oral, resp. rate  18, height 5\' 1"  (1.549 m), weight 116.7 kg, last menstrual period 12/24/2019, SpO2 99 %, currently breastfeeding.  Physical Exam Vitals and nursing note reviewed.  Constitutional:      Appearance: She is well-developed.  HENT:     Head: Normocephalic.  Eyes:     General: No visual field deficit.    Extraocular Movements: Extraocular movements intact.     Right eye: No nystagmus.     Left eye: No nystagmus.  Cardiovascular:     Rate and Rhythm: Normal rate and regular rhythm.  Pulmonary:     Effort: Pulmonary effort is normal.  Abdominal:     Palpations: Abdomen is soft.     Tenderness: There is no abdominal tenderness. There is no guarding or rebound.  Musculoskeletal:        General: Normal range of motion.     Cervical back: Neck supple.     Comments: Right knee not swollen but hit on knee when falling today and reports it is painful  Skin:    General: Skin is warm and dry.  Neurological:      Mental Status: She is alert and oriented to person, place, and time.     Cranial Nerves: No cranial nerve deficit or facial asymmetry.     Sensory: No sensory deficit.     Motor: No weakness.     Coordination: Coordination normal.     Gait: Gait normal.  Psychiatric:        Mood and Affect: Mood normal.        Behavior: Behavior normal.     MAU Course  Procedures  MDM After Tylenol, headache resolved.  Client is feeling well.  Ice to knee went well.  Has no difficulty walking.  Is in agreement for discharge  Assessment and Plan  Syncope, first episode at [redacted]w[redacted]d  Plan Eat every 3-4 hours and have protein every time you eat. Watch for clues that you are approaching a situation where you might faint and sit down or lie down immediately. Keep your appointments in the office. See orthopedics if your knee worsens. Keep ice to your knee twice a day if it is painful. Take Tylenol 325 mg 2 tablets by mouth every 4 hours if needed for pain. Drink at least 8 8-oz glasses of water every day.  Leland Staszewski L Alice Vitelli 07/08/2020, 1:17 PM

## 2020-07-08 NOTE — Discharge Instructions (Signed)
Eat every 3-4 hours and have protein every time you eat. Watch for clues that you are approaching a situation where you might faint and sit down or lie down immediately. Keep your appointments in the office. See orthopedics if your knee worsens. Keep ice to your knee twice a day if it is painful. Take Tylenol 325 mg 2 tablets by mouth every 4 hours if needed for pain. Drink at least 8 8-oz glasses of water every day.   Syncope Syncope is when you pass out (faint) for a short time. It is caused by a sudden decrease in blood flow to the brain. Signs that you may be about to pass out include:  Feeling dizzy or light-headed.  Feeling sick to your stomach (nauseous).  Seeing all white or all black.  Having cold, clammy skin. If you pass out, get help right away. Call your local emergency services (911 in the [place] Do not drive yourself to the hospital. Follow these instructions at home: Watch for any changes in your symptoms. Take these actions to stay safe and help with your symptoms: Lifestyle  Do not drive, use machinery, or play sports until your doctor says it is okay.  Do not drink alcohol.  Do not use any products that contain nicotine or tobacco, such as cigarettes and e-cigarettes. If you need help quitting, ask your doctor.  Drink enough fluid to keep your pee (urine) pale yellow. General instructions  Take over-the-counter and prescription medicines only as told by your doctor.  If you are taking blood pressure or heart medicine, sit up and stand up slowly. Spend a few minutes getting ready to sit and then stand. This can help you feel less dizzy.  Have someone stay with you until you feel stable.  If you start to feel like you might pass out, lie down right away and raise (elevate) your feet above the level of your heart. Breathe deeply and steadily. Wait until all of the symptoms are gone.  Keep all follow-up visits as told by your doctor. This is important. Get  help right away if:  You have a very bad headache.  You pass out once or more than once.  You have pain in your chest, belly, or back.  You have a very fast or uneven heartbeat (palpitations).  It hurts to breathe.  You are bleeding from your mouth or your bottom (rectum).  You have black or tarry poop (stool).  You have jerky movements that you cannot control (seizure).  You are confused.  You have trouble walking.  You are very weak.  You have vision problems. These symptoms may be an emergency. Do not wait to see if the symptoms will go away. Get medical help right away. Call your local emergency services (911 in the [place] Do not drive yourself to the hospital. Summary  Syncope is when you pass out (faint) for a short time. It is caused by a sudden decrease in blood flow to the brain.  Signs that you may be about to faint include feeling dizzy, light-headed, or sick to your stomach, seeing all white or all black, or having cold, clammy skin.  If you start to feel like you might pass out, lie down right away and raise (elevate) your feet above the level of your heart. Breathe deeply and steadily. Wait until all of the symptoms are gone. This information is not intended to replace advice given to you by your health care provider. Make sure you discuss  any questions you have with your health care provider. Document Revised: 12/09/2017 Document Reviewed: 12/09/2017 Elsevier Patient Education  2020 [employer].

## 2020-08-01 ENCOUNTER — Encounter (INDEPENDENT_AMBULATORY_CARE_PROVIDER_SITE_OTHER): Payer: Self-pay | Admitting: Family Medicine

## 2020-08-28 ENCOUNTER — Ambulatory Visit (INDEPENDENT_AMBULATORY_CARE_PROVIDER_SITE_OTHER): Payer: Managed Care, Other (non HMO) | Admitting: Family Medicine

## 2020-08-29 ENCOUNTER — Ambulatory Visit (INDEPENDENT_AMBULATORY_CARE_PROVIDER_SITE_OTHER): Payer: Managed Care, Other (non HMO) | Admitting: Family Medicine

## 2020-08-29 ENCOUNTER — Encounter (INDEPENDENT_AMBULATORY_CARE_PROVIDER_SITE_OTHER): Payer: Self-pay | Admitting: Family Medicine

## 2020-08-29 ENCOUNTER — Other Ambulatory Visit: Payer: Self-pay

## 2020-08-29 VITALS — BP 119/72 | HR 109 | Temp 98.2°F | Ht 61.0 in | Wt 270.0 lb

## 2020-08-29 DIAGNOSIS — E8881 Metabolic syndrome: Secondary | ICD-10-CM | POA: Diagnosis not present

## 2020-08-29 DIAGNOSIS — Z6841 Body Mass Index (BMI) 40.0 and over, adult: Secondary | ICD-10-CM

## 2020-08-29 DIAGNOSIS — E88819 Insulin resistance, unspecified: Secondary | ICD-10-CM

## 2020-08-29 DIAGNOSIS — Z3A24 24 weeks gestation of pregnancy: Secondary | ICD-10-CM

## 2020-08-30 NOTE — Progress Notes (Signed)
Chief Complaint:   OBESITY Megan Ball is here to discuss her progress with her obesity treatment plan along with follow-up of her obesity related diagnoses. Megan Ball is on practicing portion control and making smarter food choices, such as increasing vegetables and decreasing simple carbohydrates and states she is following her eating plan approximately 30% of the time. Megan Ball states she has been doing more walking.  Today's visit was #: 11 Starting weight: 243 lbs Starting date: 11/17/2019 Today's weight: 270 lbs Today's date: 08/29/2020  Interim History: Megan Ball is at 24.[redacted] weeks GA.  She will have a 2-hour GTT at month 7.  She reports that her pregnancy is going well.  The baby is measuring about 2 weeks had a schedule.  She is starting to become more uncomfortable.  She continues to make smart food choices and exercise regularly.  We reviewed the importance of managing blood sugars during this time and exercise.  Assessment/Plan:   1. Insulin resistance She will continue to focus on protein-rich, low simple carbohydrate foods. We reviewed the importance of hydration, regular exercise for stress reduction, and restorative sleep. Continue metformin.  Lab Results  Component Value Date   INSULIN 16.2 11/17/2019   Lab Results  Component Value Date   HGBA1C 4.9 11/17/2019   2. Pregnancy with 24 completed weeks gestation Followed by her OB/GYN.  Doing well.  We discussed nutrition is related to pregnancy and the goal being to minimize weight gain in a pregnant woman with obesity.  3. Class 3 severe obesity with serious comorbidity and body mass index (BMI) of 50.0 to 59.9 in adult, unspecified obesity type (HCC)  Megan Ball is currently in the action stage of change. As such, her goal is to continue with weight loss efforts. She has agreed to practicing portion control and making smarter food choices, such as increasing vegetables and decreasing simple carbohydrates.   Exercise goals: As  is.  Behavioral modification strategies: decreasing simple carbohydrates, increasing vegetables, increasing water intake, decreasing sodium intake and increasing high fiber foods.  Megan Ball has agreed to follow-up with our clinic in 3 weeks. She was informed of the importance of frequent follow-up visits to maximize her success with intensive lifestyle modifications for her multiple health conditions.   Objective:   Blood pressure 119/72, pulse (!) 109, temperature 98.2 F (36.8 C), temperature source Oral, height 5\' 1"  (1.549 m), weight 270 lb (122.5 kg), last menstrual period 12/24/2019, SpO2 96 %, currently breastfeeding. Body mass index is 51.02 kg/m.  General: Cooperative, alert, well developed, in no acute distress. HEENT: Conjunctivae and lids unremarkable. Cardiovascular: Regular rhythm.  Lungs: Normal work of breathing. Neurologic: No focal deficits.   Lab Results  Component Value Date   CREATININE 0.68 02/06/2020   BUN 10 02/06/2020   NA 140 02/06/2020   K 4.1 02/06/2020   CL 104 02/06/2020   CO2 21 02/06/2020   Lab Results  Component Value Date   ALT 15 02/06/2020   AST 18 02/06/2020   ALKPHOS 68 02/06/2020   BILITOT 0.4 02/06/2020   Lab Results  Component Value Date   HGBA1C 4.9 11/17/2019   Lab Results  Component Value Date   INSULIN 16.2 11/17/2019   Lab Results  Component Value Date   TSH 0.854 11/17/2019   Lab Results  Component Value Date   CHOL 167 11/17/2019   HDL 53 11/17/2019   LDLCALC 104 (H) 11/17/2019   TRIG 51 11/17/2019   Lab Results  Component Value Date  WBC 5.9 02/27/2020   HGB 10.9 (L) 02/27/2020   HCT 37.0 02/27/2020   MCV 81 02/27/2020   PLT 245 02/27/2020   Lab Results  Component Value Date   IRON 82 02/27/2020   TIBC 276 02/27/2020   FERRITIN 37 02/27/2020   Attestation Statements:   Reviewed by clinician on day of visit: allergies, medications, problem list, medical history, surgical history, family history,  social history, and previous encounter notes.  Time spent on visit including pre-visit chart review and post-visit care and charting was 25 minutes.   I, Insurance claims handler, CMA, am acting as transcriptionist for Helane Rima, DO  I have reviewed the above documentation for accuracy and completeness, and I agree with the above. Helane Rima, DO

## 2020-11-21 ENCOUNTER — Encounter (INDEPENDENT_AMBULATORY_CARE_PROVIDER_SITE_OTHER): Payer: Self-pay | Admitting: Family Medicine

## 2020-11-21 ENCOUNTER — Ambulatory Visit (INDEPENDENT_AMBULATORY_CARE_PROVIDER_SITE_OTHER): Payer: Managed Care, Other (non HMO) | Admitting: Family Medicine

## 2020-11-21 ENCOUNTER — Other Ambulatory Visit: Payer: Self-pay

## 2020-11-21 VITALS — BP 108/72 | HR 90 | Temp 98.2°F | Ht 61.0 in | Wt 282.0 lb

## 2020-11-21 DIAGNOSIS — R7303 Prediabetes: Secondary | ICD-10-CM | POA: Diagnosis not present

## 2020-11-21 DIAGNOSIS — Z6841 Body Mass Index (BMI) 40.0 and over, adult: Secondary | ICD-10-CM | POA: Diagnosis not present

## 2020-11-21 DIAGNOSIS — Z3A36 36 weeks gestation of pregnancy: Secondary | ICD-10-CM

## 2020-11-21 NOTE — Progress Notes (Signed)
Chief Complaint:   OBESITY Megan Ball is here to discuss her progress with her obesity treatment plan along with follow-up of her obesity related diagnoses.   Today's visit was #: 12 Starting weight: 243 lbs Starting date: 11/17/2019 Today's weight: 282 lbs Today's date: 11/21/2020  Interim History: Megan Ball is up 39 pounds.  She is measuring ahead at 8 pounds 10 ounces.  She says she may possibly be scheduled for a cesarean.  She is at 36.5 weeks today.  She has a visit with [facility] on Friday.  She says she is planning to breastfeed.  Nutrition Plan: practicing portion control and making smarter food choices, such as increasing vegetables and decreasing simple carbohydrates.  Activity: She says she has been doing more walking.  Assessment/Plan:   1. Prediabetes She will continue to focus on protein-rich, low simple carbohydrate foods. We reviewed the importance of hydration, regular exercise for stress reduction, and restorative sleep.   Lab Results  Component Value Date   HGBA1C 4.9 11/17/2019   Lab Results  Component Value Date   INSULIN 16.2 11/17/2019   2. [redacted] weeks gestation of pregnancy Megan Ball is at [redacted] weeks gestation.  She will follow-up with her OBGYN. I look forward to seeing her again, 1-2 months postpartum.  3. Class 3 severe obesity with serious comorbidity and body mass index (BMI) of 50.0 to 59.9 in adult, unspecified obesity type (HCC)  Course: Megan Ball is currently in the action stage of change. As such, her goal is to continue with weight loss efforts.   Nutrition goals: She has agreed to practicing portion control and making smarter food choices, such as increasing vegetables and decreasing simple carbohydrates.   Exercise goals: As tolerated.  Behavioral modification strategies: increasing lean protein intake, decreasing simple carbohydrates, increasing vegetables and increasing water intake.  Megan Ball has agreed to follow-up with our clinic in 8  weeks. She was informed of the importance of frequent follow-up visits to maximize her success with intensive lifestyle modifications for her multiple health conditions.   Objective:   Blood pressure 108/72, pulse 90, temperature 98.2 F (36.8 C), temperature source Oral, height 5\' 1"  (1.549 m), weight 282 lb (127.9 kg), last menstrual period 12/24/2019, SpO2 96 %, currently breastfeeding. Body mass index is 53.28 kg/m.  General: Cooperative, alert, well developed, in no acute distress. HEENT: Conjunctivae and lids unremarkable. Cardiovascular: Regular rhythm.  Lungs: Normal work of breathing. Neurologic: No focal deficits.   Lab Results  Component Value Date   CREATININE 0.68 02/06/2020   BUN 10 02/06/2020   NA 140 02/06/2020   K 4.1 02/06/2020   CL 104 02/06/2020   CO2 21 02/06/2020   Lab Results  Component Value Date   ALT 15 02/06/2020   AST 18 02/06/2020   ALKPHOS 68 02/06/2020   BILITOT 0.4 02/06/2020   Lab Results  Component Value Date   HGBA1C 4.9 11/17/2019   Lab Results  Component Value Date   INSULIN 16.2 11/17/2019   Lab Results  Component Value Date   TSH 0.854 11/17/2019   Lab Results  Component Value Date   CHOL 167 11/17/2019   HDL 53 11/17/2019   LDLCALC 104 (H) 11/17/2019   TRIG 51 11/17/2019   Lab Results  Component Value Date   WBC 5.9 02/27/2020   HGB 10.9 (L) 02/27/2020   HCT 37.0 02/27/2020   MCV 81 02/27/2020   PLT 245 02/27/2020   Lab Results  Component Value Date   IRON 82  02/27/2020   TIBC 276 02/27/2020   FERRITIN 37 02/27/2020   Attestation Statements:   Reviewed by clinician on day of visit: allergies, medications, problem list, medical history, surgical history, family history, social history, and previous encounter notes.  Time spent on visit including pre-visit chart review and post-visit care and charting was 20 minutes.   I, Insurance claims handler, CMA, am acting as transcriptionist for Helane Rima, DO  I have reviewed  the above documentation for accuracy and completeness, and I agree with the above. Helane Rima, DO

## 2020-11-26 ENCOUNTER — Other Ambulatory Visit: Payer: Self-pay

## 2020-11-26 ENCOUNTER — Inpatient Hospital Stay (HOSPITAL_COMMUNITY)
Admission: AD | Admit: 2020-11-26 | Discharge: 2020-11-29 | DRG: 788 | Disposition: A | Discharge status: Home / Self Care | Payer: Managed Care, Other (non HMO) | Attending: Obstetrics & Gynecology | Admitting: Obstetrics & Gynecology

## 2020-11-26 ENCOUNTER — Inpatient Hospital Stay (HOSPITAL_BASED_OUTPATIENT_CLINIC_OR_DEPARTMENT_OTHER): Payer: Managed Care, Other (non HMO)

## 2020-11-26 ENCOUNTER — Inpatient Hospital Stay (HOSPITAL_COMMUNITY): Payer: Managed Care, Other (non HMO) | Admitting: Anesthesiology

## 2020-11-26 ENCOUNTER — Encounter (HOSPITAL_COMMUNITY): Payer: Self-pay | Admitting: Obstetrics & Gynecology

## 2020-11-26 DIAGNOSIS — Z3A37 37 weeks gestation of pregnancy: Secondary | ICD-10-CM

## 2020-11-26 DIAGNOSIS — O34219 Maternal care for unspecified type scar from previous cesarean delivery: Secondary | ICD-10-CM

## 2020-11-26 DIAGNOSIS — O9902 Anemia complicating childbirth: Secondary | ICD-10-CM | POA: Diagnosis present

## 2020-11-26 DIAGNOSIS — O99214 Obesity complicating childbirth: Secondary | ICD-10-CM | POA: Diagnosis present

## 2020-11-26 DIAGNOSIS — D509 Iron deficiency anemia, unspecified: Secondary | ICD-10-CM | POA: Insufficient documentation

## 2020-11-26 DIAGNOSIS — O26893 Other specified pregnancy related conditions, third trimester: Secondary | ICD-10-CM | POA: Diagnosis present

## 2020-11-26 DIAGNOSIS — Z98891 History of uterine scar from previous surgery: Secondary | ICD-10-CM

## 2020-11-26 DIAGNOSIS — E669 Obesity, unspecified: Secondary | ICD-10-CM

## 2020-11-26 DIAGNOSIS — O4703 False labor before 37 completed weeks of gestation, third trimester: Secondary | ICD-10-CM | POA: Diagnosis not present

## 2020-11-26 DIAGNOSIS — O99213 Obesity complicating pregnancy, third trimester: Secondary | ICD-10-CM | POA: Diagnosis not present

## 2020-11-26 DIAGNOSIS — Z20822 Contact with and (suspected) exposure to covid-19: Secondary | ICD-10-CM | POA: Diagnosis present

## 2020-11-26 DIAGNOSIS — O34211 Maternal care for low transverse scar from previous cesarean delivery: Secondary | ICD-10-CM | POA: Diagnosis present

## 2020-11-26 DIAGNOSIS — D649 Anemia, unspecified: Secondary | ICD-10-CM | POA: Diagnosis present

## 2020-11-26 LAB — CBC WITH DIFFERENTIAL/PLATELET
Abs Immature Granulocytes: 0.04 10*3/uL (ref 0.00–0.07)
Basophils Absolute: 0 10*3/uL (ref 0.0–0.1)
Basophils Relative: 0 %
Eosinophils Absolute: 0 10*3/uL (ref 0.0–0.5)
Eosinophils Relative: 0 %
HCT: 33.7 % — ABNORMAL LOW (ref 36.0–46.0)
Hemoglobin: 10 g/dL — ABNORMAL LOW (ref 12.0–15.0)
Immature Granulocytes: 0 %
Lymphocytes Relative: 14 %
Lymphs Abs: 1.3 10*3/uL (ref 0.7–4.0)
MCH: 22.5 pg — ABNORMAL LOW (ref 26.0–34.0)
MCHC: 29.7 g/dL — ABNORMAL LOW (ref 30.0–36.0)
MCV: 75.7 fL — ABNORMAL LOW (ref 80.0–100.0)
Monocytes Absolute: 0.6 10*3/uL (ref 0.1–1.0)
Monocytes Relative: 6 %
Neutro Abs: 7.3 10*3/uL (ref 1.7–7.7)
Neutrophils Relative %: 80 %
Platelets: 157 10*3/uL (ref 150–400)
RBC: 4.45 MIL/uL (ref 3.87–5.11)
RDW: 15.1 % (ref 11.5–15.5)
WBC: 9.3 10*3/uL (ref 4.0–10.5)
nRBC: 0 % (ref 0.0–0.2)

## 2020-11-26 LAB — GROUP B STREP BY PCR: Group B strep by PCR: NEGATIVE

## 2020-11-26 LAB — TYPE AND SCREEN
ABO/RH(D): O POS
Antibody Screen: NEGATIVE

## 2020-11-26 LAB — RESP PANEL BY RT-PCR (FLU A&B, COVID) ARPGX2
Influenza A by PCR: NEGATIVE
Influenza B by PCR: NEGATIVE
SARS Coronavirus 2 by RT PCR: NEGATIVE

## 2020-11-26 MED ORDER — ONDANSETRON HCL 4 MG/2ML IJ SOLN: 4.0000 mg | Freq: Four times a day (QID) | INTRAMUSCULAR | Status: DC | PRN

## 2020-11-26 MED ORDER — OXYTOCIN-SODIUM CHLORIDE 30-0.9 UT/500ML-% IV SOLN
2.5000 [IU]/h | INTRAVENOUS | Status: DC
  Filled 2020-11-26: qty 500

## 2020-11-26 MED ORDER — FENTANYL-BUPIVACAINE-NACL 0.5-0.125-0.9 MG/250ML-% EP SOLN
12.0000 mL/h | EPIDURAL | Status: DC | PRN
Start: 2020-11-26 — End: 2020-11-26

## 2020-11-26 MED ORDER — OXYCODONE-ACETAMINOPHEN 5-325 MG PO TABS: 2.0000 | ORAL_TABLET | ORAL | Status: DC | PRN

## 2020-11-26 MED ORDER — FENTANYL CITRATE (PF) 100 MCG/2ML IJ SOLN
50.0000 ug | INTRAMUSCULAR | Status: DC | PRN
  Administered 2020-11-26 – 2020-11-27 (×4): 100 ug via INTRAVENOUS
  Filled 2020-11-26 (×4): qty 2

## 2020-11-26 MED ORDER — OXYTOCIN BOLUS FROM INFUSION
333.0000 mL | Freq: Once | INTRAVENOUS | Status: AC
  Administered 2020-11-27: 333 mL via INTRAVENOUS

## 2020-11-26 MED ORDER — LIDOCAINE HCL (PF) 1 % IJ SOLN
INTRAMUSCULAR | Status: DC | PRN
  Administered 2020-11-26 (×3): 4 mL via EPIDURAL
  Administered 2020-11-26: 5 mL via EPIDURAL

## 2020-11-26 MED ORDER — LACTATED RINGERS IV SOLN
500.0000 mL | Freq: Once | INTRAVENOUS | Status: AC
  Administered 2020-11-27: 500 mL via INTRAVENOUS

## 2020-11-26 MED ORDER — EPHEDRINE 5 MG/ML INJ: 10.0000 mg | INTRAVENOUS | Status: DC | PRN

## 2020-11-26 MED ORDER — LACTATED RINGERS IV SOLN: 500.0000 mL | INTRAVENOUS | Status: DC | PRN

## 2020-11-26 MED ORDER — SODIUM CHLORIDE (PF) 0.9 % IJ SOLN
INTRAMUSCULAR | Status: DC | PRN
  Administered 2020-11-26: 12 mL/h via EPIDURAL

## 2020-11-26 MED ORDER — OXYCODONE-ACETAMINOPHEN 5-325 MG PO TABS: 1.0000 | ORAL_TABLET | ORAL | Status: DC | PRN

## 2020-11-26 MED ORDER — PHENYLEPHRINE 40 MCG/ML (10ML) SYRINGE FOR IV PUSH (FOR BLOOD PRESSURE SUPPORT)
80.0000 ug | PREFILLED_SYRINGE | INTRAVENOUS | Status: DC | PRN
  Administered 2020-11-27 (×2): 80 ug via INTRAVENOUS
  Filled 2020-11-26 (×2): qty 10

## 2020-11-26 MED ORDER — LIDOCAINE HCL (PF) 1 % IJ SOLN: 30.0000 mL | INTRAMUSCULAR | Status: DC | PRN

## 2020-11-26 MED ORDER — PHENYLEPHRINE 40 MCG/ML (10ML) SYRINGE FOR IV PUSH (FOR BLOOD PRESSURE SUPPORT)
80.0000 ug | PREFILLED_SYRINGE | INTRAVENOUS | Status: AC | PRN
  Administered 2020-11-26 – 2020-11-27 (×3): 80 ug via INTRAVENOUS

## 2020-11-26 MED ORDER — LACTATED RINGERS IV BOLUS
1000.0000 mL | Freq: Once | INTRAVENOUS | Status: AC
  Administered 2020-11-26: 1000 mL via INTRAVENOUS

## 2020-11-26 MED ORDER — DIPHENHYDRAMINE HCL 50 MG/ML IJ SOLN: 12.5000 mg | INTRAMUSCULAR | Status: DC | PRN

## 2020-11-26 MED ORDER — SOD CITRATE-CITRIC ACID 500-334 MG/5ML PO SOLN
30.0000 mL | ORAL | Status: DC | PRN
  Filled 2020-11-26: qty 15

## 2020-11-26 MED ORDER — LACTATED RINGERS IV SOLN: INTRAVENOUS | Status: DC

## 2020-11-26 MED ORDER — OXYTOCIN-SODIUM CHLORIDE 30-0.9 UT/500ML-% IV SOLN
1.0000 m[IU]/min | INTRAVENOUS | Status: DC
  Administered 2020-11-26: 1 m[IU]/min via INTRAVENOUS
  Administered 2020-11-27: 2 m[IU]/min via INTRAVENOUS

## 2020-11-26 MED ORDER — TERBUTALINE SULFATE 1 MG/ML IJ SOLN: 0.2500 mg | Freq: Once | INTRAMUSCULAR | Status: DC | PRN

## 2020-11-26 MED ORDER — ACETAMINOPHEN 325 MG PO TABS: 650.0000 mg | ORAL_TABLET | ORAL | Status: DC | PRN

## 2020-11-26 MED ORDER — FENTANYL-BUPIVACAINE-NACL 0.5-0.125-0.9 MG/250ML-% EP SOLN
EPIDURAL | Status: AC
  Filled 2020-11-26: qty 250

## 2020-11-26 NOTE — Anesthesia Preprocedure Evaluation (Signed)
Anesthesia Evaluation  Patient identified by MRN, date of birth, ID band Patient awake    Reviewed: Allergy & Precautions, Patient's Chart, lab work & pertinent test results  History of Anesthesia Complications Negative for: history of anesthetic complications  Airway Mallampati: II  TM Distance: >3 FB Neck ROM: Full    Dental no notable dental hx.    Pulmonary neg pulmonary ROS,    Pulmonary exam normal        Cardiovascular negative cardio ROS Normal cardiovascular exam     Neuro/Psych negative neurological ROS  negative psych ROS   GI/Hepatic negative GI ROS, Neg liver ROS,   Endo/Other  Morbid obesity  Renal/GU negative Renal ROS  negative genitourinary   Musculoskeletal negative musculoskeletal ROS (+)   Abdominal   Peds  Hematology  (+) anemia , Hgb 10.0, Plt 157    Anesthesia Other Findings Day of surgery medications reviewed with patient.  Reproductive/Obstetrics (+) Pregnancy                             Anesthesia Physical Anesthesia Plan  ASA: III  Anesthesia Plan: Epidural   Post-op Pain Management:    Induction:   PONV Risk Score and Plan: Treatment may vary due to age or medical condition  Airway Management Planned: Natural Airway  Additional Equipment:   Intra-op Plan:   Post-operative Plan:   Informed Consent: I have reviewed the patients History and Physical, chart, labs and discussed the procedure including the risks, benefits and alternatives for the proposed anesthesia with the patient or authorized representative who has indicated his/her understanding and acceptance.       Plan Discussed with:   Anesthesia Plan Comments:         Anesthesia Quick Evaluation

## 2020-11-26 NOTE — Anesthesia Procedure Notes (Signed)
Epidural Patient location during procedure: OB Start time: 11/26/2020 6:00 PM End time: 11/26/2020 6:08 PM  Staffing Anesthesiologist: Kaylyn Layer, MD Performed: anesthesiologist   Preanesthetic Checklist Completed: patient identified, IV checked, risks and benefits discussed, monitors and equipment checked, pre-op evaluation and timeout performed  Epidural Patient position: sitting Prep: DuraPrep and site prepped and draped Patient monitoring: continuous pulse ox, blood pressure and heart rate Approach: midline Location: L3-L4 Injection technique: LOR air  Needle:  Needle type: Tuohy  Needle gauge: 17 G Needle length: 9 cm Needle insertion depth: 8 cm Catheter type: closed end flexible Catheter size: 19 Gauge Catheter at skin depth: 13 cm Test dose: negative and Other (1% lidocaine)  Assessment Events: blood not aspirated, injection not painful, no injection resistance, no paresthesia and negative IV test  Additional Notes Patient identified. Risks, benefits, and alternatives discussed with patient including but not limited to bleeding, infection, nerve damage, paralysis, failed block, incomplete pain control, headache, blood pressure changes, nausea, vomiting, reactions to medication, itching, and postpartum back pain. Confirmed with bedside nurse the patient's most recent platelet count. Confirmed with patient that they are not currently taking any anticoagulation, have any bleeding history, or any family history of bleeding disorders. Patient expressed understanding and wished to proceed. All questions were answered. Sterile technique was used throughout the entire procedure. Please see nursing notes for vital signs.   Difficult placement due to habitus. 3 attempts required. Test dose was given through epidural catheter and negative prior to continuing to dose epidural or start infusion. Warning signs of high block given to the patient including shortness of breath,  tingling/numbness in hands, complete motor block, or any concerning symptoms with instructions to call for help. Patient was given instructions on fall risk and not to get out of bed. All questions and concerns addressed with instructions to call with any issues or inadequate analgesia.  Reason for block:procedure for pain

## 2020-11-26 NOTE — MAU Note (Signed)
Presents with c/o ctxs since 0200 this morning.  Denies VB or LOF.  Endorses +FM.

## 2020-11-26 NOTE — H&P (Signed)
OB ADMISSION/ HISTORY & PHYSICAL:  Admission Date: 11/26/2020 11:20 AM  Admit  Diagnosis: Latent labor, prev C/S for FTP desires TOLAC  Megan Ball is a 30 y.o. female G3P1011 [redacted]w[redacted]d presenting for ctx. Endorses active FM, denies LOF and vaginal bleeding. Ctx began @ 0100 and increased in intensity and frquency. Primary LTCS in 2014 for FTP after 33 hr labor and arrest @ 7 cm, baby was 8# 14 oz @ 40 wks. EFW today 8#14 oz (99%). Pt counseled on mode of delivery options and pt desires TOLAC.   History of current pregnancy: G3P1011   Patient entered care with [facility] at 14+5 wks.   EDC by LMP and congruent w/ 8+3 wk U/S.   Antenatal testing: for obesity and size>dates started at 32 weeks  Significant prenatal events:  Patient Active Problem List   Diagnosis Date Noted  . Anemia 11/26/2020  . Morbid obesity (HCC) 11/26/2020  . Previous cesarean delivery affecting pregnancy 11/26/2020  . Eczema 12/04/2013    Prenatal Labs: ABO, Rh:  O positive Antibody:  negative Rubella:   immune RPR:   NR HBsAg:   NR HIV:   NR GTT: passed 1 hr GBS:   collected 1/14, no resulted, rapid test collected today GC/CHL: neg/neg Genetics: low-risk female Tdap/influenza vaccines: tdap current-declined flu   OB History  Gravida Para Term Preterm AB Living  3 1 1   1 1   SAB IAB Ectopic Multiple Live Births  1       1    # Outcome Date GA Lbr Len/2nd Weight Sex Delivery Anes PTL Lv  3 Current           2 Term 09/16/13 [redacted]w[redacted]d  4026 g M CS-LTranv EPI  LIV     Birth Comments: Baby voided several times in delivery room.  1 SAB             Medical / Surgical History: Past medical history:  Past Medical History:  Diagnosis Date  . Anemia   . Eczema   . Gestational thrombocytopenia (HCC) 09/17/2013  . Obesity   . Postpartum care following cesarean delivery (11/7) 09/16/2013  . Seasonal allergies   . Shortness of breath     Past surgical history:  Past Surgical History:  Procedure Laterality Date   . CESAREAN SECTION N/A 09/16/2013   Procedure: CESAREAN SECTION;  Surgeon: 13/05/2013, MD;  Location: [facility];  Service: Obstetrics;  Laterality: N/A;  . TONSILLECTOMY     Family History:  Family History  Problem Relation Age of Onset  . Obesity Mother   . High blood pressure Father   . Obesity Father   . Hypertension Maternal Grandmother   . Cancer Maternal Grandfather        Throat Cancer    Social History:  reports that she has never smoked. She has never used smokeless tobacco. She reports previous alcohol use. She reports that she does not use drugs.  Allergies: Patient has no known allergies.   Current Medications at time of admission:  Prior to Admission medications   Medication Sig Start Date End Date Taking? Authorizing Provider  Prenatal Vit-Fe Fumarate-FA (PRENATAL MULTIVITAMIN) TABS tablet Take 1 tablet by mouth daily at 12 noon.   Yes [provider]  Doxylamine-Pyridoxine 10-10 MG TBEC Take 1-2 tablets twice daily as needed for nausea Patient not taking: No sig reported 04/16/20   06/16/20, DO  metFORMIN (GLUCOPHAGE) 500 MG tablet Take 1 tablet (500 mg total) by mouth daily.  Patient not taking: No sig reported 04/16/20   Helane Rima, DO    Review of Systems: Constitutional: Negative   HENT: Negative   Eyes: Negative   Respiratory: Negative   Cardiovascular: Negative   Gastrointestinal: Negative  Genitourinary: neg for bloody show, neg for LOF   Musculoskeletal: Negative   Skin: Negative   Neurological: Negative   Endo/Heme/Allergies: Negative   Psychiatric/Behavioral: Negative    Physical Exam: VS: Blood pressure 138/79, pulse (!) 112, temperature 98.2 F (36.8 C), temperature source Oral, resp. rate 20, last menstrual period 12/24/2019, SpO2 100 %, currently breastfeeding. AAO x3, no signs of distress Cardiovascular: RRR Respiratory: Lung fields clear to ausculation GU/GI: Abdomen gravid, non-tender, non-distended, active FM,  vertex, EFW 8+14 per U/S on 11/26/20 Extremities: trace edema, negative for pain, tenderness, and cords  Cervical exam:Dilation: 5 Effacement (%): 90 Station: -3 Exam by:: Lizbeth Bark, CNM FHR: baseline rate 145 / variability moderate / accelerations present / absent decelerations TOCO: 2-3 min   Prenatal Transfer Tool  Maternal Diabetes: No Genetic Screening: Normal Maternal Ultrasounds/Referrals: Normal Fetal Ultrasounds or other Referrals:  None Maternal Substance Abuse:  No Significant Maternal Medications:  None Significant Maternal Lab Results: Other: GBS pending    Assessment: 30 y.o. G3P1011 [redacted]w[redacted]d Prev C.S for FTP, desires TOLAC  Latent stage of labor FHR category 1 GBS PCR results pending Pain management plan: Epidural   Plan:  Admit to L&D Routine admission orders Epidural PRN  Dr Mora Appl notified of admission and plan of care  Roma Schanz MSN, CNM 11/26/2020 4:38 PM

## 2020-11-26 NOTE — Anesthesia Procedure Notes (Signed)
Epidural Patient location during procedure: OB Start time: 11/26/2020 7:24 PM End time: 11/26/2020 7:32 PM  Staffing Anesthesiologist: Beryle Lathe, MD Performed: anesthesiologist   Preanesthetic Checklist Completed: patient identified, IV checked, risks and benefits discussed, monitors and equipment checked, pre-op evaluation and timeout performed  Epidural Patient position: sitting Prep: DuraPrep Patient monitoring: continuous pulse ox and blood pressure Approach: midline Location: L4-L5 Injection technique: LOR saline  Needle:  Needle type: Tuohy  Needle gauge: 17 G Needle length: 15 cm Needle insertion depth: 10 cm Catheter size: 19 Gauge Catheter at skin depth: 15 cm Test dose: negative and Other (1% lidocaine)  Assessment Events: blood not aspirated  Additional Notes Patient identified. Risks including, but not limited to, bleeding, infection, nerve damage, paralysis, inadequate analgesia, blood pressure changes, nausea, vomiting, allergic reaction, postpartum back pain, itching, and headache were discussed. Patient expressed understanding and wished to proceed. Sterile prep and drape, including hand hygiene, mask, and sterile gloves were used. The patient was positioned and the spine was prepped. The skin was anesthetized with lidocaine. No paraesthesia or other complication noted. The patient did not experience any signs of intravascular injection such as tinnitus or metallic taste in mouth, nor signs of intrathecal spread such as rapid motor block. Please see nursing notes for vital signs. The patient tolerated the procedure well.   Leslye Peer, MDReason for block:procedure for pain

## 2020-11-26 NOTE — Progress Notes (Signed)
Subjective:    Pt has had 2 epidurals and no relief w/ either. Last epidural cath was pulled and pt will now use IV sedation for pain management. Pt may have an epidural if she is willing to have a third attempt. Discussed amniotomy and IUPC placement and pt agrees.  Objective:    VS: BP (!) 124/46   Pulse (!) 121   Temp 98.2 F (36.8 C) (Oral)   Resp 17   Ht 5\' 1"  (1.549 m)   Wt 127.9 kg   LMP 12/24/2019 (Exact Date)   SpO2 98%   BMI 53.28 kg/m  FHR : baseline 130 / variability moderate / accelerations [resent / early decelerations Toco: contractions every 2-4 minutes / MVU 120 Membranes: AROM @ 2151, clear Dilation: 7 Effacement (%): 90 Station: -2 Presentation: Vertex Exam by:: [email or web address] CNM   Assessment/Plan:   30 y.o. G3P1011 104w3d Prev C/S for FTP, desires TOLAC  Labor: Progressing normally Fetal Wellbeing:  Category I Pain Control:  Epidural placement x2, no relief, will use IV sedation I/D:  GBS negative Anticipated MOD:  NSVD  [redacted]w[redacted]d MSN, CNM 11/26/2020 10:06 PM

## 2020-11-27 ENCOUNTER — Inpatient Hospital Stay (HOSPITAL_COMMUNITY): Payer: Managed Care, Other (non HMO) | Admitting: Anesthesiology

## 2020-11-27 ENCOUNTER — Encounter (HOSPITAL_COMMUNITY): Payer: Self-pay | Admitting: Obstetrics & Gynecology

## 2020-11-27 ENCOUNTER — Encounter (HOSPITAL_COMMUNITY)
Admission: AD | Disposition: A | Discharge status: Home / Self Care | Payer: Self-pay | Source: Home / Self Care | Attending: Obstetrics & Gynecology

## 2020-11-27 LAB — RPR: RPR Ser Ql: NONREACTIVE

## 2020-11-27 SURGERY — Surgical Case
Anesthesia: Epidural

## 2020-11-27 MED ORDER — SIMETHICONE 80 MG PO CHEW
80.0000 mg | CHEWABLE_TABLET | Freq: Three times a day (TID) | ORAL | Status: DC
  Administered 2020-11-27 – 2020-11-29 (×5): 80 mg via ORAL
  Filled 2020-11-27 (×5): qty 1

## 2020-11-27 MED ORDER — EPHEDRINE 5 MG/ML INJ: 10.0000 mg | INTRAVENOUS | Status: DC | PRN

## 2020-11-27 MED ORDER — PRENATAL MULTIVITAMIN CH
1.0000 | ORAL_TABLET | Freq: Every day | ORAL | Status: DC
  Administered 2020-11-28: 1 via ORAL
  Filled 2020-11-27: qty 1

## 2020-11-27 MED ORDER — NALOXONE HCL 0.4 MG/ML IJ SOLN: 0.4000 mg | INTRAMUSCULAR | Status: DC | PRN

## 2020-11-27 MED ORDER — DEXTROSE 5 % IV SOLN
1.0000 ug/kg/h | INTRAVENOUS | Status: DC | PRN
Start: 2020-11-27 — End: 2020-11-29
  Filled 2020-11-27: qty 5

## 2020-11-27 MED ORDER — OXYTOCIN-SODIUM CHLORIDE 30-0.9 UT/500ML-% IV SOLN
INTRAVENOUS | Status: AC
  Filled 2020-11-27: qty 500

## 2020-11-27 MED ORDER — ACETAMINOPHEN 500 MG PO TABS
1000.0000 mg | ORAL_TABLET | Freq: Four times a day (QID) | ORAL | Status: AC
  Administered 2020-11-27 – 2020-11-28 (×3): 1000 mg via ORAL
  Filled 2020-11-27 (×3): qty 2

## 2020-11-27 MED ORDER — KETOROLAC TROMETHAMINE 30 MG/ML IJ SOLN
30.0000 mg | Freq: Four times a day (QID) | INTRAMUSCULAR | Status: AC
  Administered 2020-11-27 – 2020-11-28 (×3): 30 mg via INTRAVENOUS
  Filled 2020-11-27 (×3): qty 1

## 2020-11-27 MED ORDER — NALBUPHINE HCL 10 MG/ML IJ SOLN: 5.0000 mg | INTRAMUSCULAR | Status: DC | PRN

## 2020-11-27 MED ORDER — PHENYLEPHRINE 40 MCG/ML (10ML) SYRINGE FOR IV PUSH (FOR BLOOD PRESSURE SUPPORT): 80.0000 ug | PREFILLED_SYRINGE | INTRAVENOUS | Status: DC | PRN

## 2020-11-27 MED ORDER — LACTATED RINGERS IV SOLN: INTRAVENOUS | Status: DC

## 2020-11-27 MED ORDER — DIPHENHYDRAMINE HCL 50 MG/ML IJ SOLN: 12.5000 mg | INTRAMUSCULAR | Status: DC | PRN

## 2020-11-27 MED ORDER — SODIUM CHLORIDE 0.9% FLUSH: 3.0000 mL | INTRAVENOUS | Status: DC | PRN

## 2020-11-27 MED ORDER — DEXTROSE 5 % IV SOLN
3.0000 g | INTRAVENOUS | Status: AC
  Administered 2020-11-27: 3 g via INTRAVENOUS
  Filled 2020-11-27: qty 3000

## 2020-11-27 MED ORDER — LACTATED RINGERS IV SOLN: 500.0000 mL | Freq: Once | INTRAVENOUS | Status: DC

## 2020-11-27 MED ORDER — KETOROLAC TROMETHAMINE 30 MG/ML IJ SOLN
30.0000 mg | Freq: Four times a day (QID) | INTRAMUSCULAR | Status: DC
  Administered 2020-11-27: 30 mg via INTRAVENOUS
  Filled 2020-11-27: qty 1

## 2020-11-27 MED ORDER — PHENYLEPHRINE 40 MCG/ML (10ML) SYRINGE FOR IV PUSH (FOR BLOOD PRESSURE SUPPORT)
80.0000 ug | PREFILLED_SYRINGE | INTRAVENOUS | Status: DC | PRN
Start: 2020-11-27 — End: 2020-11-27

## 2020-11-27 MED ORDER — ACETAMINOPHEN 500 MG PO TABS: 1000.0000 mg | ORAL_TABLET | Freq: Four times a day (QID) | ORAL | Status: DC

## 2020-11-27 MED ORDER — NALBUPHINE HCL 10 MG/ML IJ SOLN
5.0000 mg | Freq: Once | INTRAMUSCULAR | Status: AC | PRN
Start: 2020-11-27 — End: 2020-11-27
  Administered 2020-11-27: 5 mg via INTRAVENOUS
  Filled 2020-11-27: qty 1

## 2020-11-27 MED ORDER — MORPHINE SULFATE (PF) 0.5 MG/ML IJ SOLN
INTRAMUSCULAR | Status: AC
  Filled 2020-11-27: qty 10

## 2020-11-27 MED ORDER — KETOROLAC TROMETHAMINE 30 MG/ML IJ SOLN
INTRAMUSCULAR | Status: AC
  Filled 2020-11-27: qty 1

## 2020-11-27 MED ORDER — OXYTOCIN-SODIUM CHLORIDE 30-0.9 UT/500ML-% IV SOLN
INTRAVENOUS | Status: DC | PRN
  Administered 2020-11-27 (×2): 30 [IU] via INTRAVENOUS

## 2020-11-27 MED ORDER — SCOPOLAMINE 1 MG/3DAYS TD PT72
MEDICATED_PATCH | TRANSDERMAL | Status: AC
  Filled 2020-11-27: qty 1

## 2020-11-27 MED ORDER — SCOPOLAMINE 1 MG/3DAYS TD PT72
1.0000 | MEDICATED_PATCH | Freq: Once | TRANSDERMAL | Status: DC
  Administered 2020-11-27: 1.5 mg via TRANSDERMAL

## 2020-11-27 MED ORDER — WITCH HAZEL-GLYCERIN EX PADS: 1.0000 "application " | MEDICATED_PAD | CUTANEOUS | Status: DC | PRN

## 2020-11-27 MED ORDER — COCONUT OIL OIL: 1.0000 "application " | TOPICAL_OIL | Status: DC | PRN

## 2020-11-27 MED ORDER — KETOROLAC TROMETHAMINE 30 MG/ML IJ SOLN: 30.0000 mg | Freq: Four times a day (QID) | INTRAMUSCULAR | Status: AC | PRN

## 2020-11-27 MED ORDER — SODIUM BICARBONATE 8.4 % IV SOLN
INTRAVENOUS | Status: DC | PRN
  Administered 2020-11-27: 5 mL via EPIDURAL
  Administered 2020-11-27: 3 mL via EPIDURAL

## 2020-11-27 MED ORDER — SOD CITRATE-CITRIC ACID 500-334 MG/5ML PO SOLN
30.0000 mL | ORAL | Status: AC
  Administered 2020-11-27: 30 mL via ORAL

## 2020-11-27 MED ORDER — OXYTOCIN-SODIUM CHLORIDE 30-0.9 UT/500ML-% IV SOLN
2.5000 [IU]/h | INTRAVENOUS | Status: AC
  Administered 2020-11-27: 2.5 [IU]/h via INTRAVENOUS

## 2020-11-27 MED ORDER — ONDANSETRON HCL 4 MG/2ML IJ SOLN
INTRAMUSCULAR | Status: DC | PRN
  Administered 2020-11-27: 4 mg via INTRAVENOUS

## 2020-11-27 MED ORDER — DIBUCAINE (PERIANAL) 1 % EX OINT: 1.0000 "application " | TOPICAL_OINTMENT | CUTANEOUS | Status: DC | PRN

## 2020-11-27 MED ORDER — SENNOSIDES-DOCUSATE SODIUM 8.6-50 MG PO TABS
2.0000 | ORAL_TABLET | Freq: Every day | ORAL | Status: DC
  Administered 2020-11-28 – 2020-11-29 (×2): 2 via ORAL
  Filled 2020-11-27 (×2): qty 2

## 2020-11-27 MED ORDER — NALBUPHINE HCL 10 MG/ML IJ SOLN: 5.0000 mg | Freq: Once | INTRAMUSCULAR | Status: AC | PRN

## 2020-11-27 MED ORDER — DIPHENHYDRAMINE HCL 25 MG PO CAPS: 25.0000 mg | ORAL_CAPSULE | ORAL | Status: DC | PRN

## 2020-11-27 MED ORDER — FENTANYL-BUPIVACAINE-NACL 0.5-0.125-0.9 MG/250ML-% EP SOLN
EPIDURAL | Status: AC
  Filled 2020-11-27: qty 250

## 2020-11-27 MED ORDER — MENTHOL 3 MG MT LOZG: 1.0000 | LOZENGE | OROMUCOSAL | Status: DC | PRN

## 2020-11-27 MED ORDER — BUPIVACAINE HCL (PF) 0.5 % IJ SOLN
INTRAMUSCULAR | Status: AC
  Filled 2020-11-27: qty 30

## 2020-11-27 MED ORDER — OXYCODONE-ACETAMINOPHEN 5-325 MG PO TABS: 1.0000 | ORAL_TABLET | ORAL | Status: DC | PRN

## 2020-11-27 MED ORDER — DIPHENHYDRAMINE HCL 25 MG PO CAPS: 25.0000 mg | ORAL_CAPSULE | Freq: Four times a day (QID) | ORAL | Status: DC | PRN

## 2020-11-27 MED ORDER — FENTANYL-BUPIVACAINE-NACL 0.5-0.125-0.9 MG/250ML-% EP SOLN: 12.0000 mL/h | EPIDURAL | Status: DC | PRN

## 2020-11-27 MED ORDER — EPHEDRINE 5 MG/ML INJ
10.0000 mg | INTRAVENOUS | Status: DC | PRN
Start: 2020-11-27 — End: 2020-11-27

## 2020-11-27 MED ORDER — ZOLPIDEM TARTRATE 5 MG PO TABS: 5.0000 mg | ORAL_TABLET | Freq: Every evening | ORAL | Status: DC | PRN

## 2020-11-27 MED ORDER — ONDANSETRON HCL 4 MG/2ML IJ SOLN: 4.0000 mg | Freq: Three times a day (TID) | INTRAMUSCULAR | Status: DC | PRN

## 2020-11-27 MED ORDER — BUPIVACAINE HCL 0.5 % IJ SOLN
INTRAMUSCULAR | Status: DC | PRN
  Administered 2020-11-27: 30 mL

## 2020-11-27 MED ORDER — LIDOCAINE HCL (PF) 1 % IJ SOLN
INTRAMUSCULAR | Status: DC | PRN
  Administered 2020-11-27: 4 mL via EPIDURAL
  Administered 2020-11-27: 5 mL via EPIDURAL

## 2020-11-27 MED ORDER — TETANUS-DIPHTH-ACELL PERTUSSIS 5-2.5-18.5 LF-MCG/0.5 IM SUSY: 0.5000 mL | PREFILLED_SYRINGE | Freq: Once | INTRAMUSCULAR | Status: DC

## 2020-11-27 MED ORDER — MEPERIDINE HCL 25 MG/ML IJ SOLN: 6.2500 mg | INTRAMUSCULAR | Status: DC | PRN

## 2020-11-27 MED ORDER — MORPHINE SULFATE (PF) 0.5 MG/ML IJ SOLN
INTRAMUSCULAR | Status: DC | PRN
  Administered 2020-11-27: 2 mg via EPIDURAL

## 2020-11-27 MED ORDER — ENOXAPARIN SODIUM 60 MG/0.6ML ~~LOC~~ SOLN
60.0000 mg | SUBCUTANEOUS | Status: DC
  Administered 2020-11-28 – 2020-11-29 (×2): 60 mg via SUBCUTANEOUS
  Filled 2020-11-27 (×2): qty 0.6

## 2020-11-27 MED ORDER — SIMETHICONE 80 MG PO CHEW: 80.0000 mg | CHEWABLE_TABLET | ORAL | Status: DC | PRN

## 2020-11-27 MED ORDER — FENTANYL CITRATE (PF) 100 MCG/2ML IJ SOLN: 25.0000 ug | INTRAMUSCULAR | Status: DC | PRN

## 2020-11-27 MED ORDER — SODIUM CHLORIDE (PF) 0.9 % IJ SOLN
INTRAMUSCULAR | Status: DC | PRN
  Administered 2020-11-27: 12 mL/h via EPIDURAL

## 2020-11-27 MED ORDER — ONDANSETRON HCL 4 MG/2ML IJ SOLN
INTRAMUSCULAR | Status: AC
  Filled 2020-11-27: qty 2

## 2020-11-27 SURGICAL SUPPLY — 42 items
BARRIER ADHS 3X4 INTERCEED (GAUZE/BANDAGES/DRESSINGS) ×2 IMPLANT
BENZOIN TINCTURE PRP APPL 2/3 (GAUZE/BANDAGES/DRESSINGS) ×2 IMPLANT
CHLORAPREP W/TINT 26ML (MISCELLANEOUS) ×2 IMPLANT
CLAMP CORD UMBIL (MISCELLANEOUS) IMPLANT
CLOTH BEACON ORANGE TIMEOUT ST (SAFETY) ×2 IMPLANT
DRSG OPSITE POSTOP 4X10 (GAUZE/BANDAGES/DRESSINGS) ×2 IMPLANT
ELECT REM PT RETURN 9FT ADLT (ELECTROSURGICAL) ×2
ELECTRODE REM PT RTRN 9FT ADLT (ELECTROSURGICAL) ×1 IMPLANT
EXTRACTOR VACUUM KIWI (MISCELLANEOUS) ×2 IMPLANT
GAUZE SPONGE 4X4 12PLY STRL LF (GAUZE/BANDAGES/DRESSINGS) ×4 IMPLANT
GLOVE BIO SURGEON STRL SZ 6.5 (GLOVE) ×2 IMPLANT
GLOVE BIOGEL PI IND STRL 7.0 (GLOVE) ×2 IMPLANT
GLOVE BIOGEL PI INDICATOR 7.0 (GLOVE) ×2
GOWN STRL REUS W/TWL LRG LVL3 (GOWN DISPOSABLE) ×6 IMPLANT
HOVERMATT SINGLE USE (MISCELLANEOUS) ×2 IMPLANT
KIT ABG SYR 3ML LUER SLIP (SYRINGE) IMPLANT
NEEDLE HYPO 22GX1.5 SAFETY (NEEDLE) IMPLANT
NEEDLE HYPO 25X5/8 SAFETYGLIDE (NEEDLE) IMPLANT
NS IRRIG 1000ML POUR BTL (IV SOLUTION) ×2 IMPLANT
PACK C SECTION WH (CUSTOM PROCEDURE TRAY) ×2 IMPLANT
PAD ABD 7.5X8 STRL (GAUZE/BANDAGES/DRESSINGS) ×2 IMPLANT
PAD ABD DERMACEA PRESS 5X9 (GAUZE/BANDAGES/DRESSINGS) ×2 IMPLANT
PAD OB MATERNITY 4.3X12.25 (PERSONAL CARE ITEMS) ×2 IMPLANT
PENCIL SMOKE EVAC W/HOLSTER (ELECTROSURGICAL) ×2 IMPLANT
RETRACTOR TRAXI PANNICULUS (MISCELLANEOUS) ×1 IMPLANT
RTRCTR C-SECT PINK 25CM LRG (MISCELLANEOUS) ×2 IMPLANT
STRIP CLOSURE SKIN 1/2X4 (GAUZE/BANDAGES/DRESSINGS) ×2 IMPLANT
STRIP SURGICAL 1/2 X 6 IN (GAUZE/BANDAGES/DRESSINGS) ×2 IMPLANT
SUT CHROMIC 2 0 CT 1 (SUTURE) ×4 IMPLANT
SUT MNCRL 0 VIOLET CTX 36 (SUTURE) ×3 IMPLANT
SUT MONOCRYL 0 CTX 36 (SUTURE) ×3
SUT PDS AB 0 CTX 60 (SUTURE) ×2 IMPLANT
SUT PLAIN 2 0 (SUTURE)
SUT PLAIN ABS 2-0 CT1 27XMFL (SUTURE) IMPLANT
SUT VIC AB 0 CTX 36 (SUTURE) ×2
SUT VIC AB 0 CTX36XBRD ANBCTRL (SUTURE) ×2 IMPLANT
SUT VIC AB 4-0 KS 27 (SUTURE) ×4 IMPLANT
SYR CONTROL 10ML LL (SYRINGE) IMPLANT
TOWEL OR 17X24 6PK STRL BLUE (TOWEL DISPOSABLE) ×2 IMPLANT
TRAXI PANNICULUS RETRACTOR (MISCELLANEOUS) ×1
TRAY FOLEY W/BAG SLVR 14FR LF (SET/KITS/TRAYS/PACK) ×2 IMPLANT
WATER STERILE IRR 1000ML POUR (IV SOLUTION) ×2 IMPLANT

## 2020-11-27 NOTE — Anesthesia Preprocedure Evaluation (Signed)
Anesthesia Evaluation  Patient identified by MRN, date of birth, ID band Patient awake    Reviewed: Allergy & Precautions, NPO status , Patient's Chart, lab work & pertinent test results  History of Anesthesia Complications Negative for: history of anesthetic complications  Airway Mallampati: II  TM Distance: >3 FB Neck ROM: Full    Dental   Pulmonary neg pulmonary ROS,    Pulmonary exam normal        Cardiovascular negative cardio ROS Normal cardiovascular exam     Neuro/Psych negative neurological ROS  negative psych ROS   GI/Hepatic negative GI ROS, Neg liver ROS,   Endo/Other  Morbid obesity  Renal/GU negative Renal ROS  negative genitourinary   Musculoskeletal negative musculoskeletal ROS (+)   Abdominal (+) + obese,   Peds  Hematology  (+) anemia ,  Hgb 10.0, Plt 157    Anesthesia Other Findings Covid test negative   Reproductive/Obstetrics (+) Pregnancy  TOLAC 2 failed epidurals during this labor                              Anesthesia Physical  Anesthesia Plan  ASA: III  Anesthesia Plan: Epidural   Post-op Pain Management:    Induction:   PONV Risk Score and Plan: 2 and Treatment may vary due to age or medical condition  Airway Management Planned: Natural Airway  Additional Equipment: None  Intra-op Plan:   Post-operative Plan:   Informed Consent: I have reviewed the patients History and Physical, chart, labs and discussed the procedure including the risks, benefits and alternatives for the proposed anesthesia with the patient or authorized representative who has indicated his/her understanding and acceptance.       Plan Discussed with: Anesthesiologist  Anesthesia Plan Comments: (Labs reviewed. Platelets acceptable, patient not taking any blood thinning medications. Per RN, FHR tracing reported to be stable enough for sitting procedure. Lengthy  discussion had regarding 3rd attempt at an epidural during this particular labor process. Risks and benefits discussed with patient, including PDPH, backache, epidural hematoma, high risk of failed epidural, blood pressure changes, allergic reaction, and nerve injury. Patient expressed understanding and wished to proceed.)        Anesthesia Quick Evaluation

## 2020-11-27 NOTE — Progress Notes (Signed)
Subjective:    Resting comfortably with epidural. C/O rectal pressure. SVE 7/80/-2, unchanged.  MVUs 120. Discussion with pt about restarting pitocin. Pt requesting to do so. CAT 1.   Objective:    VS: BP 119/65 (BP Location: Right Arm)   Pulse (!) 134   Temp 99.1 F (37.3 C) (Axillary)   Resp 20   Ht 5\' 1"  (1.549 m)   Wt 127.9 kg   LMP 12/24/2019 (Exact Date)   SpO2 97%   BMI 53.28 kg/m  FHR : baseline 130 / variability moderate / accelerations absent / early decelerations Toco: contractions every 2-4 minutes / MVU 115-160 Membranes: AROM @ 2151 Dilation: 7 Effacement (%): 80 Station: -2 Presentation: Vertex Exam by:: kim cnm Pitocin 2 mU/min now  Assessment/Plan:   28 y.o. 28 [redacted]w[redacted]d admitted for spontaneous labor. Request TOLAC. No cervical change. MVUs not adequate will restart pitocin 2x2. CAT 1  Labor: No progressing. Starting Pitocin. Fetal Wellbeing:  Category I Pain Control:  Epidural I/D:  GBS negative Anticipated MOD:  possible cesarean   Dr [redacted]w[redacted]d updated on pt status and POC  Mora Appl MSN, CNM 11/27/2020 7:36 AM

## 2020-11-27 NOTE — Transfer of Care (Signed)
Immediate Anesthesia Transfer of Care Note  Patient: Megan Ball  Procedure(s) Performed: CESAREAN SECTION (N/A )  Patient Location: PACU  Anesthesia Type:Epidural  Level of Consciousness: awake, alert  and oriented  Airway & Oxygen Therapy: Patient Spontanous Breathing  Post-op Assessment: Report given to RN and Post -op Vital signs reviewed and stable  Post vital signs: Reviewed and stable  Last Vitals:  Vitals Value Taken Time  BP 150/79 11/27/20 1348  Temp    Pulse 113 11/27/20 1354  Resp 38 11/27/20 1354  SpO2 98 % 11/27/20 1354  Vitals shown include unvalidated device data.  Last Pain:  Vitals:   11/27/20 1200  TempSrc:   PainSc: 0-No pain         Complications: No complications documented.

## 2020-11-27 NOTE — Progress Notes (Signed)
Subjective:    Reports no relief w/ IV sedation. Dr. Mal Amabile of anesthesia aware and willing to attempt a third epidural. Pt reports effective pain manangement w/ epidural in last pregnancy.   Objective:    VS: BP (!) 146/46   Pulse (!) 118   Temp 99.6 F (37.6 C)   Resp 17   Ht 5\' 1"  (1.549 m)   Wt 127.9 kg   LMP 12/24/2019 (Exact Date)   SpO2 99%   BMI 53.28 kg/m  FHR : baseline 130 / variability moderate / accelerations present / early decelerations IUPC: contractions every 2 minutes / MVU 260 Membranes: AROM since 2151, remains clear Dilation: 7 Effacement (%): 80 Station: -1 Presentation: Vertex Exam by:: [email or web address] CNM Pitocin 2 mU/min  Assessment/Plan:   30 y.o. G3P1011 [redacted]w[redacted]d Prev C/S for FTP Sutures ROP  Labor: adequate ctx, no cervical change Fetal Wellbeing:  Category I Pain Control:  IV pain meds I/D:  GBS neg Anticipated MOD:  NSVD   Will discuss plan w/ Dr. [redacted]w[redacted]d MSN, CNM 11/27/2020 2:58 AM

## 2020-11-27 NOTE — Lactation Note (Signed)
This note was copied from a baby's chart. Lactation Consultation Note  Patient Name: Megan Ball [id number] Date: 11/27/2020 Reason for consult: Initial assessment;Mother's request;Early term 10-38.6wks (mom changed her mind and now she wants to see lactation) Age:28 hours  Visited with mom of 23 hours old FT female, she's a P3 but had a miscarriage during her last pregnancy, she has a 28 y.o at home. She BF her first child for 3 months (see RN Rise Paganini note) and participated in the [facility] program at the [facility] and she's already familiar with hand expression. When Megan Ball assisted mom with hand expression, noticed that her nipples are flat but her tissue is very compressible. Mom has very large breasts and told LC she's planning on doing both breast and formula feeding.   Offered assistance with latch and mom agreed to wake baby up to feed, LC took baby STS to mother's left breast in cross cradle position per her request and after a couple of tries, baby was able to latch easily. Noticed rhythmical sucking pattern but no audible swallows. Baby fed for 14 minutes before self-releasing from the breast.  GOB asked LC if mom could get Medela parts/kit for her old pump. Offered to bring a DEBP kit but when family was told there was a charge for the entire kit, they changed their mind and decided to order off [employer]. LC provided cleaning supplies and flanges # 30 along with breastmilk storage bottles for mom's pumping kit at no extra charge. GOB asked LC to swaddle baby prior exiting the room.  Reviewed normal newborn behavior, cluster feeding, feeding cues, lactogenesis II, pumping schedule, size of baby's stomach and choice for supplementation. Mom will let her RN know when she's ready to start supplementing with formula, encouraged her to try with her EBM first.  Feeding plan:  1. Encouraged mom to feed baby STS 8-12 times/24 hours or sooner if feeding cues are present 2. Hand expression and breast massage  were also encouraged to try to get colostrum 3. Mom might start supplementation with formula around the time baby is turning 27 hours old, maybe before that if baby is still showing feeding cues and no colostrum is noted  BF brochure, BF resources and feeding diary were reviewed. FOB and maternal GOB were present at the time of [facility] consultation. Family reported all questions and concerns were answered, they're all aware of [place] OP services and will call PRN.   Maternal Data Formula Feeding for Exclusion: Yes Reason for exclusion: Mother's choice to formula and breast feed on admission Has patient been taught Hand Expression?: Yes Does the patient have breastfeeding experience prior to this delivery?: Yes  Feeding Feeding Type: Breast Fed  LATCH Score Latch: Grasps breast easily, tongue down, lips flanged, rhythmical sucking.  Audible Swallowing: None  Type of Nipple: Flat (but tissue is very compressible)  Comfort (Breast/Nipple): Soft / non-tender  Hold (Positioning): Assistance needed to correctly position infant at breast and maintain latch.  LATCH Score: 6  Interventions Interventions: Breast feeding basics reviewed;Assisted with latch;Skin to skin;Breast massage;Hand express;Breast compression;Adjust position  Lactation Tools Discussed/Used WIC Program: Yes   Consult Status Consult Status: Follow-up Date: 11/28/20 Follow-up type: In-patient    [name] [name] 11/27/2020, 7:15 PM

## 2020-11-27 NOTE — Op Note (Addendum)
Cesarean Section Procedure Note  Indications: previous uterine incision kerr x 1 with failure to progress past 7cm  Pre-operative Diagnosis: 1.37 week 4 day pregnancy, prior Low transverse cesarean             Section                                               2. Failed TOLAC                                               3. Failure to progress  Post-operative Diagnosis: same pl;us uterine window  Surgeon: Wynonia Hazard MD  Assistants: 1. Sallye Ober, EMA MD 2. Aldine Contes CNM  Anesthesia: Epidural anesthesia  ASA Class: 2  Procedure Details   The patient was counseled about the risks, benefits, complications of the cesarean section. The patient concurred with the proposed plan, giving informed consent.  The site of surgery properly noted/marked. The patient was taken to Operating Room # C, identified as Megan Ball and the procedure verified as C-Section Delivery. A Time Out was held and the above information confirmed.  After epidural was found to adequate , the patient was placed in the dorsal supine position with a leftward tilt, draped and prepped in the usual sterile manner. A Pfannenstiel incision was made and carried down through the subcutaneous tissue to the fascia with a 10 blade scalpel.  The fascia was incised in the midline and the fascial incision was extended laterally with Mayo scissors. The superior aspect of the fascial incision was grasped with Coker clamps x2, tented up and the rectus muscles dissected off with the bovie. The rectus was then dissected off with blunt dissection and Mayo scissors inferiorly. The rectus muscles were separated in the midline. The abdominal peritoneum was identified, and entered bluntly using surgeons finger, and the incision was extended superiorly and inferiorly with good visualization of the bladder. The Alexis retractor was then deployed. The vesicouterine peritoneum was identified, tented up, entered sharply with Metzenbaum  scissors, and the bladder flap was created digitally. Scalpel was then used to make a low transverse incision on the uterus which was extended laterally with  blunt dissection. The fetal vertex was identified, the head was brought out from the pelvis. A Kiwi vacuum was placed to aid in delivery of the vertex.  The head was then delivered easily through the uterine incision followed by the body. The A live female infant was bulb suctioned on the operative field cried vigorously, cord was clamped and cut and the infant was passed to the waiting neonatologist. Apgars 8/9. Cord blood obtained and then the placenta was then delivered spontaneously, intact and appear normal, the uterus was cleared of all clot and debris. There was a open uterine window on the left side that was repaired with 0-monocryl.  The hysterotomy was repaired with #0 Monocryl in running locked fashion. A second imbricating suture was performed using the same suture. The incision was hemostatic. Ovaries and tubes were inspected and normal. The Alexis retractor was removed. The abdominal cavity was cleared of all clot and debris. The abdominal peritoneum was reapproximated with 2-0 chromic  in a running fashion, the rectus muscles was reapproximated with #2 chromic  in interrupted fashion. The fascia was closed with 0 PDS in a running fashion. The subcuticular layer was irrigated and all bleeders cauterized.    The incision was injected with 25 mL of 0.5% Marcaine. Interrupted sutures of 2-0 plain were used to re-approximate Scarpas fascia.  The skin was closed with 4-0 vicryl in a subcuticular fashion using a Mellody Dance needle. The incision was dressed with benzoine, steri strips and pressure dressing. All sponge lap and needle counts were correct x3. Patient tolerated the procedure well and recovered in stable condition following the procedure.  Instrument, sponge, and needle counts were correct prior the abdominal closure and at the conclusion of  the case.   Findings: Live female infant, Apgars 8/9, clear amniotic fluid, normal appearing placenta, uterine window repaired, otherwise normal uterus,normal bilateral tubes and ovaries  Estimated Blood Loss: 282 mL  IVF: 1500 mL         Drains: Foley catheter  Urine output: 200 mL clear urine         Specimens: Placenta to Pathology         Implants: none         Complications:  None; patient tolerated the procedure well.         Disposition: PACU - hemodynamically stable.   Attending Attestation: I PERFORMED THE PROCEDURE AND MY ASSISTANT WAS NEEDED FOR THE COMPLEXITY OF THE CASE   Essie Hart

## 2020-11-27 NOTE — Progress Notes (Signed)
Subjective:    SVE 7-8/90/0. Discussed continuing to labor now that MVUs are adequate or plan on repeat cesarean section. R/B/A discussed. Pt requesting repeat cesarean.   Objective:    VS: BP 139/78   Pulse (!) 130   Temp 99.9 F (37.7 C) (Axillary)   Resp 20   Ht 5\' 1"  (1.549 m)   Wt 127.9 kg   LMP 12/24/2019 (Exact Date)   SpO2 97%   BMI 53.28 kg/m  FHR : baseline 145 / variability moderate / accelerations absent / early decelerations Per RN had some lates that resolved with position changes and pit in 1/2 Toco: contractions every 2-4 minutes / MVU 220 Membranes: AROM on 11/26/20 @2151  Dilation: 8 Effacement (%): 90 Station: 0 Presentation: Vertex Exam by:: k. jones RN Pitocin 6 mU/min  Assessment/Plan:   30 y.o. G3P1011 [redacted]w[redacted]d in active labor with adequate MVUs x 2 hours. Little cervical change made. Pt requesting cesarean delivery.   Labor: failure to progress. Will proceed with cesarean delivery. Fetal Wellbeing:  Category I Pain Control:  Epidural I/D:  GBS negative Anticipated MOD:  Repeat cesarean delivery   Dr 34 and OR team aware of pt status and POC.  [redacted]w[redacted]d MSN, CNM 11/27/2020 11:36 AM

## 2020-11-27 NOTE — Op Note (Signed)
MD preoperative Note  Procedure: Repeat cesarean section  Preoperative Diagnosis: Failed Tolac, failure to progress  Indication:  Patient is a 30 year old G3P1 at 37 weeks 4 days admitted in labor. Patient has progressed to 7cm however she has been 7 cm for the past 14 hours. Patient counseled that  The progression of her labor has stalled and she voiced the desire now for a repeat cesarean section.  Patient with an epidural in place.  Fetal heart tracing has been category I.   Labs: Hb: 10 Platelets 157  Blood type O positive.    Discussed risks of surgery to include hemorrhage (possibly necessitating a blood transfusion), infection, injury to internal vessels or organs or baby at time of procedure, cesarean hysterectomy.  Patient voiced understanding and all inquiries answered.  Consent signed, witness and will be scanned into chart.   Megan Ball Vidhi Delellis

## 2020-11-27 NOTE — Anesthesia Procedure Notes (Addendum)
Epidural Patient location during procedure: OB Start time: 11/27/2020 3:34 AM End time: 11/27/2020 3:39 AM  Staffing Anesthesiologist: Beryle Lathe, MD Performed: anesthesiologist   Preanesthetic Checklist Completed: patient identified, IV checked, risks and benefits discussed, monitors and equipment checked, pre-op evaluation and timeout performed  Epidural Patient position: sitting Prep: DuraPrep Patient monitoring: continuous pulse ox and blood pressure Approach: midline Location: L3-L4 Injection technique: LOR air  Needle:  Needle type: Tuohy  Needle gauge: 17 G Needle length: 15 cm Needle insertion depth: 9 cm Catheter size: 19 Gauge Catheter at skin depth: 15 cm Test dose: negative and Other (1% lidocaine)  Assessment Events: blood not aspirated  Additional Notes Patient identified. Risks including, but not limited to, bleeding, infection, nerve damage, paralysis, inadequate analgesia, blood pressure changes, nausea, vomiting, allergic reaction, postpartum back pain, itching, and headache were discussed. Patient expressed understanding and wished to proceed. Sterile prep and drape, including hand hygiene, mask, and sterile gloves were used. The patient was positioned and the spine was prepped. The skin was anesthetized with lidocaine. An intentional dural puncture was performed by passing a 25ga spinal needle through the Tuohy to confirm proper placement of Tuohy needle in epidural space. CSF obtained from spinal needle, confirming placement. The spinal needle was removed and the epidural catheter was placed via Tuohy. No paraesthesia or other complication noted. The patient did not experience any signs of intravascular injection such as tinnitus or metallic taste in mouth, nor signs of intrathecal spread such as rapid motor block. Please see nursing notes for vital signs. The patient tolerated the procedure well.   Leslye Peer, MDReason for block:procedure for  pain

## 2020-11-28 ENCOUNTER — Encounter (HOSPITAL_COMMUNITY): Payer: Self-pay | Admitting: Obstetrics & Gynecology

## 2020-11-28 LAB — CBC
HCT: 24.6 % — ABNORMAL LOW (ref 36.0–46.0)
Hemoglobin: 7.7 g/dL — ABNORMAL LOW (ref 12.0–15.0)
MCH: 23.1 pg — ABNORMAL LOW (ref 26.0–34.0)
MCHC: 31.3 g/dL (ref 30.0–36.0)
MCV: 73.9 fL — ABNORMAL LOW (ref 80.0–100.0)
Platelets: 155 10*3/uL (ref 150–400)
RBC: 3.33 MIL/uL — ABNORMAL LOW (ref 3.87–5.11)
RDW: 15.1 % (ref 11.5–15.5)
WBC: 11.5 10*3/uL — ABNORMAL HIGH (ref 4.0–10.5)
nRBC: 0 % (ref 0.0–0.2)

## 2020-11-28 MED ORDER — OXYCODONE HCL 5 MG PO TABS
5.0000 mg | ORAL_TABLET | ORAL | Status: DC | PRN
  Administered 2020-11-28 – 2020-11-29 (×2): 5 mg via ORAL
  Filled 2020-11-28 (×2): qty 1

## 2020-11-28 MED ORDER — FERROUS SULFATE 325 (65 FE) MG PO TABS
325.0000 mg | ORAL_TABLET | Freq: Two times a day (BID) | ORAL | Status: DC
  Administered 2020-11-28 – 2020-11-29 (×2): 325 mg via ORAL
  Filled 2020-11-28 (×2): qty 1

## 2020-11-28 MED ORDER — IBUPROFEN 800 MG PO TABS
800.0000 mg | ORAL_TABLET | Freq: Three times a day (TID) | ORAL | Status: DC
  Administered 2020-11-28 – 2020-11-29 (×2): 800 mg via ORAL
  Filled 2020-11-28 (×2): qty 1

## 2020-11-28 NOTE — Progress Notes (Signed)
Subjective: Postpartum Day 1: Cesarean Delivery failed TOLAC Patient reports tolerating PO, + flatus and no problems voiding.  Denies dizziness.  Breasting: Baby latching, supplementing with formula and pumping.  Objective: Vital signs in last 24 hours: Temp:  [98.1 F (36.7 C)-99.2 F (37.3 C)] 99.2 F (37.3 C) (01/19 0600) Pulse Rate:  [103-130] 110 (01/19 0600) Resp:  [18-34] 20 (01/19 0600) BP: (114-150)/(53-80) 127/73 (01/19 0600) SpO2:  [95 %-99 %] 95 % (01/19 0600)  Physical Exam:  General: alert, cooperative and no distress Lochia: appropriate Uterine Fundus: firm Incision: covered with no oozing DVT Evaluation: No evidence of DVT seen on physical exam.  Recent Labs    11/26/20 1600 11/28/20 0527  HGB 10.0* 7.7*  HCT 33.7* 24.6*    Assessment/Plan: Status post Cesarean section. Doing well postoperatively.  Continue current care. Start iron supplementation Anticipate discharge tomorrow.  Henderson Newcomer Megan Ball 11/28/2020, 11:28 AM

## 2020-11-28 NOTE — Lactation Note (Signed)
This note was copied from a baby's chart. Lactation Consultation Note  Patient Name: Megan Ball Date: 11/28/2020 Reason for consult: Follow-up assessment Age:28 hours   P2 mother whose infant is now 42 hours old.  This is an ETI at 37+4 weeks.  Mother breast fed her first child( now 50 years old) for 3 months.  RN in room assessing infant upon my arrival.  Offered to assist with latching and mother agreeable.  She last breast fed at 0500 for 5 minutes but baby did not feed well.  Taught hand expression; mother unable to express colostrum drops at this time.  Mother's breasts are large, soft and non tender and nipples are short shafted and intact.  Nipples evert with stimulation and breast tissue is very soft and compressible.  Positioned appropriately in the football hold and assisted baby to latch easily.  She opened and latched deeply, however, was not interested in initiating a suck and pulled off.  Assisted to latch a second time and baby took a few intermittent sucks for a duration of 3  minutes.  Demonstrated breast compressions and gentle stimulation.  Baby self released.  Offered to initiate the DEBP for mother to help stimulate breasts in hopes of providing extra colostrum for baby.  Mother agreeable.  Sized mother to be a #27 flange size.  Demonstrated the manual pump.  Mother was able to demonstrate pump assembly and disassembly.  Reviewed settings and observed her pumping for approximately 10 minutes while reviewing basic breast feeding concepts.  No colostrum noted when I left the room.  Colostrum container provided and milk storage times reviewed.  Mother will call for latch assistance as needed.  She will continue to feed with cues or at least every three hours due to gestational age.  Mother has a DEBP for home use.  Father present.  RN updated.     Maternal Data    Feeding Feeding Type: Breast Fed  LATCH Score Latch: Repeated attempts needed to sustain  latch, nipple held in mouth throughout feeding, stimulation needed to elicit sucking reflex.  Audible Swallowing: None  Type of Nipple: Everted at rest and after stimulation (Short shafted; manual pump for nipple eversion)  Comfort (Breast/Nipple): Soft / non-tender  Hold (Positioning): Assistance needed to correctly position infant at breast and maintain latch.  LATCH Score: 6  Interventions Interventions: Breast feeding basics reviewed;Assisted with latch;Skin to skin;Breast massage;Hand express;Pre-pump if needed;Breast compression;Adjust position;DEBP;Hand pump;Position options;Support pillows  Lactation Tools Discussed/Used Tools: Pump;Flanges Flange Size: 27 Breast pump type: Double-Electric Breast Pump;Manual Pump Education: Setup, frequency, and cleaning;Milk Storage Initiated by:: Lotoya Casella Date initiated:: 11/28/20   Consult Status Consult Status: Follow-up Date: 11/29/20 Follow-up type: In-patient    Deserae Jennings R Tykeshia Tourangeau 11/28/2020, 9:06 AM

## 2020-11-29 LAB — SURGICAL PATHOLOGY

## 2020-11-29 MED ORDER — OXYCODONE HCL 5 MG PO TABS: 5.0000 mg | ORAL_TABLET | ORAL | 0 refills | Status: DC | PRN

## 2020-11-29 MED ORDER — IBUPROFEN 800 MG PO TABS: 800.0000 mg | ORAL_TABLET | Freq: Three times a day (TID) | ORAL | 0 refills | Status: DC

## 2020-11-29 NOTE — Discharge Summary (Signed)
Postpartum Discharge Summary       Patient Name: Megan Ball DOB: 28-Sep-1991 MRN: [id number]  Date of admission: 11/26/2020 Delivery date:11/27/2020  Delivering provider: Sanjuana Kava  Date of discharge: 11/29/2020  Admitting diagnosis: Previous cesarean section [Z98.891] Intrauterine pregnancy: [redacted]w[redacted]d    Secondary diagnosis:  Active Problems:   Morbid obesity (HCornucopia   Previous cesarean delivery affecting pregnancy  Additional problems: failed TOLAC    Discharge diagnosis: Term Pregnancy Delivered                                              Post partum procedures:na Augmentation: AROM and Pitocin Complications: None  Hospital course: Onset of Labor With Unplanned C/S   30y.o. yo [id number] admitted in Latent Labor on 11/26/2020. Patient had a labor course significant for TOLAC attempt. The patient went for cesarean section due to Arrest of Dilation. Delivery details as follows: Membrane Rupture Time/Date: 9:51 PM ,11/26/2020   Delivery Method:C-Section, Low Vertical  Details of operation can be found in separate operative note. Patient had an uncomplicated postpartum course.  She is ambulating,tolerating a regular diet, passing flatus, and urinating well.  Patient is discharged home in stable condition 11/29/20.  Newborn Data: Birth date:11/27/2020  Birth time:12:55 PM  Gender:Female  Living status:Living  Apgars:8 ,9  Weight:3790 g   Magnesium Sulfate received: No BMZ received: No Rhophylac:N/A MMR:No T-DaP:Given prenatally Flu: No Transfusion:No  Physical exam  Vitals:   11/28/20 0600 11/28/20 1759 11/28/20 2140 11/29/20 0520  BP: 127/73 (!) 143/90 120/78 139/90  Pulse: (!) 110 (!) 102 (!) 102 95  Resp: '20 20 19 18  ' Temp: 99.2 F (37.3 C) 99 F (37.2 C) 97.8 F (36.6 C) 97.7 F (36.5 C)  TempSrc:  Oral Oral Oral  SpO2: 95% 100% 100% 98%  Weight:      Height:       General: alert, cooperative and no distress Lochia: appropriate Uterine  Fundus: firm Incision: Healing well with no significant drainage DVT Evaluation: No evidence of DVT seen on physical exam. Negative Homan's sign. No cords or calf tenderness. No significant calf/ankle edema. Labs: Lab Results  Component Value Date   WBC 11.5 (H) 11/28/2020   HGB 7.7 (L) 11/28/2020   HCT 24.6 (L) 11/28/2020   MCV 73.9 (L) 11/28/2020   PLT 155 11/28/2020   CMP Latest Ref Rng & Units 02/06/2020  Glucose 65 - 99 mg/dL 101(H)  BUN 6 - 20 mg/dL 10  Creatinine 0.57 - 1.00 mg/dL 0.68  Sodium 134 - 144 mmol/L 140  Potassium 3.5 - 5.2 mmol/L 4.1  Chloride 96 - 106 mmol/L 104  CO2 20 - 29 mmol/L 21  Calcium 8.7 - 10.2 mg/dL 9.0  Total Protein 6.0 - 8.5 g/dL 6.7  Total Bilirubin 0.0 - 1.2 mg/dL 0.4  Alkaline Phos 39 - 117 IU/L 68  AST 0 - 40 IU/L 18  ALT 0 - 32 IU/L 15   Edinburgh Score: No flowsheet data found.    After visit meds:  Allergies as of 11/29/2020   No Known Allergies     Medication List    STOP taking these medications   Doxylamine-Pyridoxine 10-10 MG Tbec   metFORMIN 500 MG tablet Commonly known as: GLUCOPHAGE   prenatal multivitamin Tabs tablet     TAKE these medications   ibuprofen 800  MG tablet Commonly known as: ADVIL Take 1 tablet (800 mg total) by mouth every 8 (eight) hours.   oxyCODONE 5 MG immediate release tablet Commonly known as: Oxy IR/ROXICODONE Take 1-2 tablets (5-10 mg total) by mouth every 4 (four) hours as needed for breakthrough pain.        Discharge home in stable condition Infant Feeding: Bottle and Breast Infant Disposition:home with mother Discharge instruction: per After Visit Summary and Postpartum booklet. Activity: Advance as tolerated. Pelvic rest for 6 weeks.  Diet: routine diet Anticipated Birth Control: Condoms and Unsure Postpartum Appointment:6 weeks Additional Postpartum F/U: na Future Appointments: Future Appointments  Date Time Provider [place]  01/16/2021 11:20 AM Briscoe Deutscher, DO [facility] None   Follow up Visit:  Follow-up Information    [facility], [place] Follow up in 6 week(s).   Specialty: Obstetrics and Gynecology Contact information: [address] [place] [address] [phone]                   11/29/2020 Ike Bene, CNM

## 2020-11-29 NOTE — Lactation Note (Signed)
This note was copied from a baby's chart. Lactation Consultation Note  Patient Name: Megan Ball [id number] Date: 11/29/2020 Reason for consult: Follow-up assessment Age:28 hours  Follow up visit to 47 hours old with 5.54% weight loss at the time of consult. Infant is in basinet with a pacifier upon arrival. Mother states she has not been breastfeeding, mostly offering formula, but she continues pumping. Mother explains she has a personal pump at home. Mother is a [facility] participant and already has an appointment scheduled for next week. Last feeding infant took ~56mL of formula, approximately 40 minutes prior to this visit. Infant has been been having good voids and stools, per mother. Reinforced the correct volume of supplement according to age (30-60 mL). LC provided supplementation volume sheet. Infant pushed out pacifier and started showing hunger cues. LC offered to pace bottle-fed formula. Mother agreed. Infant took 20 mL additional. Demonstrated burping and both upright and side-lying position.   Mother is eager to breastfeed and inquires when milk will come into volume. Discussed signs of milk coming into volume as well as continuing proper stimulation. Mother will continue pumping at home with formula supplementation.   Feeding plan:  1. Breastfeed following hunger cues, no more than 30 minutes.  2. Stimulate infant awake at the breast 3. Offer breast 8 -  12 times in 24h period to establish good milk supply.   4. If needed supplement with formula following guidelines, pace bottle feeding and fullness cues.   5. Encouraged maternal rest, hydration and food intake.  6. Contact Lactation Services or local resources for support, questions or concerns.    All questions answered at this time. Family is ready to be discharged home today.   Maternal Data Formula Feeding for Exclusion: Yes Reason for exclusion: Mother's choice to formula and breast feed on admission Has patient been  taught Hand Expression?: Yes Does the patient have breastfeeding experience prior to this delivery?: Yes  Feeding Feeding Type: Bottle Fed - Formula Nipple Type: Extra Slow Flow  Interventions Interventions: Breast feeding basics reviewed;Skin to skin;Hand express;Breast massage;DEBP;Expressed milk  Lactation Tools Discussed/Used Breast pump type: Double-Electric Breast Pump WIC Program: Yes   Consult Status Consult Status: Complete Date: 11/29/20 Follow-up type: Call as needed    Megan Ball 11/29/2020, 12:37 PM

## 2020-11-30 ENCOUNTER — Encounter (HOSPITAL_COMMUNITY): Payer: Self-pay | Admitting: Obstetrics & Gynecology

## 2020-11-30 NOTE — Anesthesia Postprocedure Evaluation (Signed)
Anesthesia Post Note  Patient: Megan Ball  Procedure(s) Performed: CESAREAN SECTION (N/A )     Patient location during evaluation: PACU Anesthesia Type: Epidural Level of consciousness: awake and alert Pain management: pain level controlled Vital Signs Assessment: post-procedure vital signs reviewed and stable Respiratory status: spontaneous breathing and respiratory function stable Cardiovascular status: blood pressure returned to baseline and stable Postop Assessment: epidural receding Anesthetic complications: no   No complications documented.  Last Vitals:  Vitals:   11/28/20 2140 11/29/20 0520  BP: 120/78 139/90  Pulse: (!) 102 95  Resp: 19 18  Temp: 36.6 C 36.5 C  SpO2: 100% 98%    Last Pain:  Vitals:   11/29/20 1110  TempSrc:   PainSc: 0-No pain                 Kennieth Rad

## 2020-12-04 NOTE — Addendum Note (Signed)
Addendum  created 12/04/20 0824 by Beryle Lathe, MD   Intraprocedure Staff edited

## 2020-12-04 NOTE — Addendum Note (Signed)
Addendum  created 12/04/20 0823 by Beryle Lathe, MD   Intraprocedure Event edited, Intraprocedure Staff edited

## 2021-01-14 ENCOUNTER — Ambulatory Visit (INDEPENDENT_AMBULATORY_CARE_PROVIDER_SITE_OTHER): Payer: Managed Care, Other (non HMO) | Admitting: Family Medicine

## 2021-01-16 ENCOUNTER — Ambulatory Visit (INDEPENDENT_AMBULATORY_CARE_PROVIDER_SITE_OTHER): Payer: Managed Care, Other (non HMO) | Admitting: Family Medicine

## 2021-02-25 ENCOUNTER — Other Ambulatory Visit: Payer: Self-pay

## 2021-02-25 ENCOUNTER — Ambulatory Visit (INDEPENDENT_AMBULATORY_CARE_PROVIDER_SITE_OTHER): Payer: Managed Care, Other (non HMO) | Admitting: Family Medicine

## 2021-02-25 ENCOUNTER — Encounter (INDEPENDENT_AMBULATORY_CARE_PROVIDER_SITE_OTHER): Payer: Self-pay | Admitting: Family Medicine

## 2021-02-25 VITALS — BP 129/82 | HR 67 | Temp 97.3°F | Ht 61.0 in | Wt 257.0 lb

## 2021-02-25 DIAGNOSIS — Z98891 History of uterine scar from previous surgery: Secondary | ICD-10-CM

## 2021-02-25 DIAGNOSIS — Z9189 Other specified personal risk factors, not elsewhere classified: Secondary | ICD-10-CM

## 2021-02-25 DIAGNOSIS — Z6841 Body Mass Index (BMI) 40.0 and over, adult: Secondary | ICD-10-CM

## 2021-02-25 DIAGNOSIS — R7303 Prediabetes: Secondary | ICD-10-CM | POA: Diagnosis not present

## 2021-02-25 DIAGNOSIS — D509 Iron deficiency anemia, unspecified: Secondary | ICD-10-CM | POA: Diagnosis not present

## 2021-02-26 NOTE — Progress Notes (Signed)
Chief Complaint:   OBESITY Megan Ball is here to discuss her progress with her obesity treatment plan along with follow-up of her obesity related diagnoses.   Today's visit was #: 13 Starting weight: 243 lbs Starting date: 11/17/2019 Today's weight: 257 lbs Today's date: 02/25/2021 Total lbs lost to date: 0 Body mass index is 48.56 kg/m.   Interim History:  Megan Ball's prepregnancy weight was 238 pounds, and now she is 257 pounds.  She is breastfeeding.  She will be going to work on May 10th.  She stopped her prenatal vitamin.  Fatigue levels are okay, she says.  Current Meal Plan: practicing portion control and making smarter food choices, such as increasing vegetables and decreasing simple carbohydrates for 25% of the time.  Current Exercise Plan: None.  Assessment/Plan:   1. Prediabetes Controlled. Goal is HgbA1c < 5.7.  Medication: None.    Plan:  She will continue to focus on protein-rich, low simple carbohydrate foods. We reviewed the importance of hydration, regular exercise for stress reduction, and restorative sleep.  Will check labs at next visit.  Lab Results  Component Value Date   HGBA1C 4.9 11/17/2019   Lab Results  Component Value Date   INSULIN 16.2 11/17/2019   2. Microcytic anemia Will check labs at next visit.  CBC Latest Ref Rng & Units 11/28/2020 11/26/2020 02/27/2020  WBC 4.0 - 10.5 K/uL 11.5(H) 9.3 5.9  Hemoglobin 12.0 - 15.0 g/dL 7.7(L) 10.0(L) 10.9(L)  Hematocrit 36.0 - 46.0 % 24.6(L) 33.7(L) 37.0  Platelets 150 - 400 K/uL 155 157 245   Lab Results  Component Value Date   IRON 82 02/27/2020   TIBC 276 02/27/2020   FERRITIN 37 02/27/2020   Lab Results  Component Value Date   VITAMINB12 226 (L) 02/27/2020   3. History of cesarean delivery [name] delivered her daughter 3 months ago.  She is still breastfeeding.   4. At risk for deficient intake of food Megan Ball was given extensive education and counseling today of more than 9 minutes on  risks associated with deficient food intake.  She is at risk while breastfeeding.  Counseled her on the importance of following our prescribed meal plan and eating adequate amounts of protein.  Discussed with Megan Ball that inadequate food intake over longer periods of time can slow their metabolism down significantly.   5. Obesity, current BMI 48.7  Course: Megan Ball is currently in the action stage of change. As such, her goal is to continue with weight loss efforts.   Nutrition goals: She has agreed to the Category 1 Plan +300 calories.   Exercise goals: For substantial health benefits, adults should do at least 150 minutes (2 hours and 30 minutes) a week of moderate-intensity, or 75 minutes (1 hour and 15 minutes) a week of vigorous-intensity aerobic physical activity, or an equivalent combination of moderate- and vigorous-intensity aerobic activity. Aerobic activity should be performed in episodes of at least 10 minutes, and preferably, it should be spread throughout the week.  Behavioral modification strategies: increasing lean protein intake, decreasing simple carbohydrates, increasing vegetables and increasing water intake.  Vernon has agreed to follow-up with our clinic in 4 weeks. She was informed of the importance of frequent follow-up visits to maximize her success with intensive lifestyle modifications for her multiple health conditions.   Objective:   Blood pressure 129/82, pulse 67, temperature (!) 97.3 F (36.3 C), temperature source Oral, height 5\' 1"  (1.549 m), weight 257 lb (116.6 kg), SpO2 95 %, unknown if  currently breastfeeding. Body mass index is 48.56 kg/m.  General: Cooperative, alert, well developed, in no acute distress. HEENT: Conjunctivae and lids unremarkable. Cardiovascular: Regular rhythm.  Lungs: Normal work of breathing. Neurologic: No focal deficits.   Lab Results  Component Value Date   CREATININE 0.68 02/06/2020   BUN 10 02/06/2020   NA 140  02/06/2020   K 4.1 02/06/2020   CL 104 02/06/2020   CO2 21 02/06/2020   Lab Results  Component Value Date   ALT 15 02/06/2020   AST 18 02/06/2020   ALKPHOS 68 02/06/2020   BILITOT 0.4 02/06/2020   Lab Results  Component Value Date   HGBA1C 4.9 11/17/2019   Lab Results  Component Value Date   INSULIN 16.2 11/17/2019   Lab Results  Component Value Date   TSH 0.854 11/17/2019   Lab Results  Component Value Date   CHOL 167 11/17/2019   HDL 53 11/17/2019   LDLCALC 104 (H) 11/17/2019   TRIG 51 11/17/2019   Lab Results  Component Value Date   WBC 11.5 (H) 11/28/2020   HGB 7.7 (L) 11/28/2020   HCT 24.6 (L) 11/28/2020   MCV 73.9 (L) 11/28/2020   PLT 155 11/28/2020   Lab Results  Component Value Date   IRON 82 02/27/2020   TIBC 276 02/27/2020   FERRITIN 37 02/27/2020   Attestation Statements:   Reviewed by clinician on day of visit: allergies, medications, problem list, medical history, surgical history, family history, social history, and previous encounter notes.  I, Insurance claims handler, CMA, am acting as transcriptionist for Helane Rima, DO  I have reviewed the above documentation for accuracy and completeness, and I agree with the above. Helane Rima, DO

## 2021-03-25 ENCOUNTER — Other Ambulatory Visit: Payer: Self-pay

## 2021-03-25 ENCOUNTER — Encounter (INDEPENDENT_AMBULATORY_CARE_PROVIDER_SITE_OTHER): Payer: Self-pay | Admitting: Adult Health

## 2021-03-25 ENCOUNTER — Ambulatory Visit (INDEPENDENT_AMBULATORY_CARE_PROVIDER_SITE_OTHER): Payer: Managed Care, Other (non HMO) | Admitting: Adult Health

## 2021-03-25 VITALS — BP 128/78 | HR 65 | Temp 97.8°F | Ht 61.0 in | Wt 257.0 lb

## 2021-03-25 DIAGNOSIS — R7303 Prediabetes: Secondary | ICD-10-CM

## 2021-03-25 DIAGNOSIS — D509 Iron deficiency anemia, unspecified: Secondary | ICD-10-CM | POA: Diagnosis not present

## 2021-03-25 DIAGNOSIS — R0602 Shortness of breath: Secondary | ICD-10-CM | POA: Insufficient documentation

## 2021-03-25 DIAGNOSIS — Z6841 Body Mass Index (BMI) 40.0 and over, adult: Secondary | ICD-10-CM

## 2021-03-25 NOTE — Progress Notes (Signed)
Chief Complaint:   OBESITY Megan Ball is here to discuss her progress with her obesity treatment plan along with follow-up of her obesity related diagnoses. Megan Ball is on practicing portion control and making smarter food choices, such as increasing vegetables and decreasing simple carbohydrates and states she is following her eating plan approximately 50% of the time. Megan Ball states she is not currently exercising.  Today's visit was #: 14 Starting weight: 243 lbs Starting date: 11/17/2019 Today's weight: 257 lbs Today's date: 03/25/2021 Total lbs lost to date: 0 Total lbs lost since last in-office visit: 0  Interim History: 11/27/2020 Cesarean section delivery of a healthy daughter. Megan Ball is currently breastfeeding- 3 times a days with daughter, also pumping 4 times a day. She is slowly weaning from breastfeeding. She wants to lose below her pre-pregnancy weight of 238 lbs.   Subjective:   1. Microcytic anemia History of anemia even prior to pregnancy.  Megan Ball reports stable energy levels.  2. SOB (shortness of breath) on exertion Megan Ball reports SOB with extreme exertion. She denies chest pain with exertion.  IC revealed RMR today 1514 Initial RMR 997 on 11/17/2019   3. Pre-diabetes Megan Ball is not on any BG lowering agents.  Lab Results  Component Value Date   HGBA1C 4.9 11/17/2019   Lab Results  Component Value Date   INSULIN 16.2 11/17/2019    Assessment/Plan:   1. Microcytic anemia Check labs today. - CBC - Anemia panel  2. SOB (shortness of breath) on exertion Takita does feel that she gets out of breath more easily that she used to when she exercises. Megan Ball's shortness of breath appears to be obesity related and exercise induced. She has agreed to work on weight loss and gradually increase exercise to treat her exercise induced shortness of breath. Will continue to monitor closely. -IC today  3. Pre-diabetes Megan Ball will continue to work on weight loss,  exercise, and decreasing simple carbohydrates to help decrease the risk of diabetes.  - Comprehensive metabolic panel - Hemoglobin A1c - Insulin, random  4. Obesity with current BMI 48.6  Megan Ball is currently in the action stage of change. As such, her goal is to continue with weight loss efforts. She has agreed to the Category 1 Plan + 100 calories.   Adjust calorie intake at next OV based on hunger levels with category 1 + 100 calories.  Exercise goals: No exercise has been prescribed at this time.  Behavioral modification strategies: increasing lean protein intake, meal planning and cooking strategies and planning for success.  Megan Ball has agreed to follow-up with our clinic in 2 weeks. She was informed of the importance of frequent follow-up visits to maximize her success with intensive lifestyle modifications for her multiple health conditions.   Megan Ball was informed we would discuss her lab results at her next visit unless there is a critical issue that needs to be addressed sooner. Megan Ball agreed to keep her next visit at the agreed upon time to discuss these results.  Objective:   Blood pressure 128/78, pulse 65, temperature 97.8 F (36.6 C), height 5\' 1"  (1.549 m), weight 257 lb (116.6 kg), SpO2 97 %, unknown if currently breastfeeding. Body mass index is 48.56 kg/m.  General: Cooperative, alert, well developed, in no acute distress. HEENT: Conjunctivae and lids unremarkable. Cardiovascular: Regular rhythm.  Lungs: Normal work of breathing. Neurologic: No focal deficits.   Lab Results  Component Value Date   CREATININE 0.68 02/06/2020   BUN 10 02/06/2020   NA  140 02/06/2020   K 4.1 02/06/2020   CL 104 02/06/2020   CO2 21 02/06/2020   Lab Results  Component Value Date   ALT 15 02/06/2020   AST 18 02/06/2020   ALKPHOS 68 02/06/2020   BILITOT 0.4 02/06/2020   Lab Results  Component Value Date   HGBA1C 4.9 11/17/2019   Lab Results  Component Value Date    INSULIN 16.2 11/17/2019   Lab Results  Component Value Date   TSH 0.854 11/17/2019   Lab Results  Component Value Date   CHOL 167 11/17/2019   HDL 53 11/17/2019   LDLCALC 104 (H) 11/17/2019   TRIG 51 11/17/2019   Lab Results  Component Value Date   WBC 11.5 (H) 11/28/2020   HGB 7.7 (L) 11/28/2020   HCT 24.6 (L) 11/28/2020   MCV 73.9 (L) 11/28/2020   PLT 155 11/28/2020   Lab Results  Component Value Date   IRON 82 02/27/2020   TIBC 276 02/27/2020   FERRITIN 37 02/27/2020     Attestation Statements:   Reviewed by clinician on day of visit: allergies, medications, problem list, medical history, surgical history, family history, social history, and previous encounter notes.  Time spent on visit including pre-visit chart review and post-visit care and charting was 32 minutes.   Edmund Hilda, CMA, am acting as transcriptionist for William Hamburger, NP.  I have reviewed the above documentation for accuracy and completeness, and I agree with the above. -  Darshay Deupree d. Minor Iden, NP-C

## 2021-03-26 LAB — CBC
Hemoglobin: 10.3 g/dL — ABNORMAL LOW (ref 11.1–15.9)
MCH: 22.8 pg — ABNORMAL LOW (ref 26.6–33.0)
MCHC: 29.3 g/dL — ABNORMAL LOW (ref 31.5–35.7)
MCV: 78 fL — ABNORMAL LOW (ref 79–97)
Platelets: 272 10*3/uL (ref 150–450)
RBC: 4.51 x10E6/uL (ref 3.77–5.28)
RDW: 13.5 % (ref 11.7–15.4)
WBC: 5.6 10*3/uL (ref 3.4–10.8)

## 2021-03-26 LAB — ANEMIA PANEL
Ferritin: 20 ng/mL (ref 15–150)
Folate, Hemolysate: 393 ng/mL
Folate, RBC: 1116 ng/mL (ref >498)
Hematocrit: 35.2 % (ref 34.0–46.6)
Iron Saturation: 10 % — ABNORMAL LOW (ref 15–55)
Iron: 33 ug/dL (ref 27–159)
Retic Ct Pct: 1.7 % (ref 0.6–2.6)
Total Iron Binding Capacity: 340 ug/dL (ref 250–450)
UIBC: 307 ug/dL (ref 131–425)
Vitamin B-12: 267 pg/mL (ref 232–1245)

## 2021-03-26 LAB — INSULIN, RANDOM: INSULIN: 10 u[IU]/mL (ref 2.6–24.9)

## 2021-03-26 LAB — COMPREHENSIVE METABOLIC PANEL
ALT: 7 IU/L (ref 0–32)
AST: 17 IU/L (ref 0–40)
Albumin/Globulin Ratio: 2 (ref 1.2–2.2)
Albumin: 4.3 g/dL (ref 3.9–5.0)
Alkaline Phosphatase: 98 IU/L (ref 44–121)
BUN/Creatinine Ratio: 14 (ref 9–23)
BUN: 10 mg/dL (ref 6–20)
Bilirubin Total: 0.4 mg/dL (ref 0.0–1.2)
CO2: 24 mmol/L (ref 20–29)
Calcium: 9.1 mg/dL (ref 8.7–10.2)
Chloride: 105 mmol/L (ref 96–106)
Creatinine, Ser: 0.7 mg/dL (ref 0.57–1.00)
Globulin, Total: 2.2 g/dL (ref 1.5–4.5)
Glucose: 85 mg/dL (ref 65–99)
Potassium: 4.1 mmol/L (ref 3.5–5.2)
Sodium: 141 mmol/L (ref 134–144)
Total Protein: 6.5 g/dL (ref 6.0–8.5)
eGFR: 121 mL/min/{1.73_m2} (ref >59)

## 2021-03-26 LAB — HEMOGLOBIN A1C
Est. average glucose Bld gHb Est-mCnc: 88 mg/dL
Hgb A1c MFr Bld: 4.7 % — ABNORMAL LOW (ref 4.8–5.6)

## 2021-04-10 ENCOUNTER — Ambulatory Visit (INDEPENDENT_AMBULATORY_CARE_PROVIDER_SITE_OTHER): Payer: Managed Care, Other (non HMO) | Admitting: Family Medicine

## 2021-07-24 ENCOUNTER — Other Ambulatory Visit: Payer: Self-pay

## 2021-07-24 ENCOUNTER — Ambulatory Visit (INDEPENDENT_AMBULATORY_CARE_PROVIDER_SITE_OTHER): Payer: Managed Care, Other (non HMO) | Admitting: Family Medicine

## 2021-07-24 ENCOUNTER — Encounter (INDEPENDENT_AMBULATORY_CARE_PROVIDER_SITE_OTHER): Payer: Self-pay | Admitting: Family Medicine

## 2021-07-24 VITALS — BP 130/78 | HR 66 | Temp 98.1°F | Ht 61.0 in | Wt 266.0 lb

## 2021-07-24 DIAGNOSIS — Z9189 Other specified personal risk factors, not elsewhere classified: Secondary | ICD-10-CM

## 2021-07-24 DIAGNOSIS — Z975 Presence of (intrauterine) contraceptive device: Secondary | ICD-10-CM | POA: Diagnosis not present

## 2021-07-24 DIAGNOSIS — E8881 Metabolic syndrome: Secondary | ICD-10-CM | POA: Diagnosis not present

## 2021-07-24 DIAGNOSIS — Z6841 Body Mass Index (BMI) 40.0 and over, adult: Secondary | ICD-10-CM

## 2021-07-24 DIAGNOSIS — E538 Deficiency of other specified B group vitamins: Secondary | ICD-10-CM

## 2021-07-24 DIAGNOSIS — D508 Other iron deficiency anemias: Secondary | ICD-10-CM | POA: Diagnosis not present

## 2021-07-24 MED ORDER — METFORMIN HCL ER 500 MG PO TB24: 500.0000 mg | ORAL_TABLET | Freq: Every day | ORAL | 1 refills | Status: DC

## 2021-07-24 NOTE — Progress Notes (Signed)
Chief Complaint:   OBESITY Megan Ball is here to discuss her progress with her obesity treatment plan along with follow-up of her obesity related diagnoses.   Today's visit was #: 15 Starting weight: 243 lbs Starting date: 11/17/2019 Today's weight: 266 lbs Today's date: 07/24/2021 Weight change since last visit: +9 lbs Total lbs lost to date: +23 lbs Body mass index is 50.26 kg/m.   Current Meal Plan: the Category 1 Plan +300 calories for 0% of the time.  Current Exercise Plan: None.  Interim History:  Megan Ball's prepregnancy weight was 238 pounds.  She had a cesarean section on 11/27/2020.  She says she has finished breastfeeding and is back to work.  She is ready to restart the program.  Assessment/Plan:   1. Insulin resistance Goal is HgbA1c < 5.7, fasting insulin closer to 5.  Medication: none.  Plan:  Refill metformin XR 500 mg daily, as per below.  She will continue to focus on protein-rich, low simple carbohydrate foods. We reviewed the importance of hydration, regular exercise for stress reduction, and restorative sleep.   Lab Results  Component Value Date   HGBA1C 4.7 (L) 03/25/2021   Lab Results  Component Value Date   INSULIN 10.0 03/25/2021   INSULIN 16.2 11/17/2019   - Refill metFORMIN (GLUCOPHAGE-XR) 500 MG 24 hr tablet; Take 1 tablet (500 mg total) by mouth daily with breakfast.  Dispense: 30 tablet; Refill: 1  2. Other iron deficiency anemia Nutrition: Iron-rich foods include dark leafy greens, red and white meats, eggs, seafood, and beans.  Certain foods and drinks prevent your body from absorbing iron properly. Avoid eating these foods in the same meal as iron-rich foods or with iron supplements. These foods include: coffee, black tea, and red wine; milk, dairy products, and foods that are high in calcium; beans and soybeans; whole grains. Constipation can be a side effect of iron supplementation. Increased water and fiber intake are helpful. Water goal: > 2  liters/day. Fiber goal: > 25 grams/day.  Plan:  Recommend OTC iron supplement every other day.  CBC Latest Ref Rng & Units 03/25/2021 11/28/2020 11/26/2020  WBC 3.4 - 10.8 x10E3/uL 5.6 11.5(H) 9.3  Hemoglobin 11.1 - 15.9 g/dL 10.3(L) 7.7(L) 10.0(L)  Hematocrit 34.0 - 46.6 % 35.2 24.6(L) 33.7(L)  Platelets 150 - 450 x10E3/uL 272 155 157   Lab Results  Component Value Date   IRON 33 03/25/2021   TIBC 340 03/25/2021   FERRITIN 20 03/25/2021   3. B12 deficiency Lab Results  Component Value Date   VITAMINB12 267 03/25/2021   Supplementation: None.  Plan:  Recommend OTC vitamin B12 1000 mcg daily.  4. IUD (intrauterine device) in place Megan Ball has a Mirena IUD in place.  5. At risk for activity intolerance Megan Ball was given approximately 8 minutes of counseling today regarding her increased risk for exercise intolerance due to iron deficiency anemia.  She was advised of strategies to prevent injury and ways to improve her cardiopulmonary fitness levels slowly over time.    6. Obesity, current BMI 50.4  Course: Megan Ball is currently in the action stage of change. As such, her goal is to continue with weight loss efforts.   Nutrition goals: She has agreed to the Category 2 Plan.   Exercise goals: All adults should avoid inactivity. Some physical activity is better than none, and adults who participate in any amount of physical activity gain some health benefits.  Behavioral modification strategies: increasing lean protein intake, decreasing simple carbohydrates, increasing  vegetables, and increasing water intake.  Megan Ball has agreed to follow-up with our clinic in 4 weeks. She was informed of the importance of frequent follow-up visits to maximize her success with intensive lifestyle modifications for her multiple health conditions.   Objective:   Blood pressure 130/78, pulse 66, temperature 98.1 F (36.7 C), temperature source Oral, height 5\' 1"  (1.549 m), weight 266 lb (120.7 kg),  SpO2 99 %, unknown if currently breastfeeding. Body mass index is 50.26 kg/m.  General: Cooperative, alert, well developed, in no acute distress. HEENT: Conjunctivae and lids unremarkable. Cardiovascular: Regular rhythm.  Lungs: Normal work of breathing. Neurologic: No focal deficits.   Lab Results  Component Value Date   CREATININE 0.70 03/25/2021   BUN 10 03/25/2021   NA 141 03/25/2021   K 4.1 03/25/2021   CL 105 03/25/2021   CO2 24 03/25/2021   Lab Results  Component Value Date   ALT 7 03/25/2021   AST 17 03/25/2021   ALKPHOS 98 03/25/2021   BILITOT 0.4 03/25/2021   Lab Results  Component Value Date   HGBA1C 4.7 (L) 03/25/2021   HGBA1C 4.9 11/17/2019   Lab Results  Component Value Date   INSULIN 10.0 03/25/2021   INSULIN 16.2 11/17/2019   Lab Results  Component Value Date   TSH 0.854 11/17/2019   Lab Results  Component Value Date   CHOL 167 11/17/2019   HDL 53 11/17/2019   LDLCALC 104 (H) 11/17/2019   TRIG 51 11/17/2019   Lab Results  Component Value Date   VD25OH 11.2 (L) 11/17/2019   Lab Results  Component Value Date   WBC 5.6 03/25/2021   HGB 10.3 (L) 03/25/2021   HCT 35.2 03/25/2021   MCV 78 (L) 03/25/2021   PLT 272 03/25/2021   Lab Results  Component Value Date   IRON 33 03/25/2021   TIBC 340 03/25/2021   FERRITIN 20 03/25/2021   Attestation Statements:   Reviewed by clinician on day of visit: allergies, medications, problem list, medical history, surgical history, family history, social history, and previous encounter notes.  I, 03/27/2021, CMA, am acting as transcriptionist for Insurance claims handler, DO  I have reviewed the above documentation for accuracy and completeness, and I agree with the above. Helane Rima, DO

## 2021-08-19 ENCOUNTER — Encounter (INDEPENDENT_AMBULATORY_CARE_PROVIDER_SITE_OTHER): Payer: Self-pay | Admitting: Family Medicine

## 2021-08-20 ENCOUNTER — Ambulatory Visit (INDEPENDENT_AMBULATORY_CARE_PROVIDER_SITE_OTHER): Payer: Managed Care, Other (non HMO) | Admitting: Family Medicine

## 2021-08-20 ENCOUNTER — Other Ambulatory Visit: Payer: Self-pay

## 2021-08-20 ENCOUNTER — Encounter (INDEPENDENT_AMBULATORY_CARE_PROVIDER_SITE_OTHER): Payer: Self-pay | Admitting: Family Medicine

## 2021-08-20 VITALS — BP 137/72 | HR 95 | Temp 98.2°F | Ht 61.0 in | Wt 265.0 lb

## 2021-08-20 DIAGNOSIS — E538 Deficiency of other specified B group vitamins: Secondary | ICD-10-CM

## 2021-08-20 DIAGNOSIS — R7303 Prediabetes: Secondary | ICD-10-CM | POA: Diagnosis not present

## 2021-08-20 DIAGNOSIS — E8881 Metabolic syndrome: Secondary | ICD-10-CM

## 2021-08-20 DIAGNOSIS — D509 Iron deficiency anemia, unspecified: Secondary | ICD-10-CM

## 2021-08-20 DIAGNOSIS — Z6841 Body Mass Index (BMI) 40.0 and over, adult: Secondary | ICD-10-CM

## 2021-08-21 MED ORDER — METFORMIN HCL ER 500 MG PO TB24: 500.0000 mg | ORAL_TABLET | Freq: Every day | ORAL | 1 refills | Status: DC

## 2021-08-22 NOTE — Progress Notes (Signed)
Chief Complaint:   OBESITY Megan Ball is here to discuss her progress with her obesity treatment plan along with follow-up of her obesity related diagnoses.   Today's visit was #: 16 Starting weight: 243 lbs Starting date: 11/17/2019 Today's weight: 265 lbs Today's date: 08/20/2021 Weight change since last visit: 1 lb Total lbs lost to date: +22 Body mass index is 50.07 kg/m.   Current Meal Plan: the Category 2 Plan for 80% of the time.  Current Exercise Plan: Increased activity/cardio for 20 minutes 2 times per week.  Interim History:  [name] says she has increased her water intake and her movement.  She has been out of metformin for 1 week (cannot find her bottle).  Assessment/Plan:   1. Prediabetes At goal. Goal is HgbA1c < 5.7.  Medication: metformin 500 mg daily.    Plan:  Refill metformin 500 mg daily, as per below.  She will continue to focus on protein-rich, low simple carbohydrate foods. We reviewed the importance of hydration, regular exercise for stress reduction, and restorative sleep.   Lab Results  Component Value Date   HGBA1C 4.7 (L) 03/25/2021   Lab Results  Component Value Date   INSULIN 10.0 03/25/2021   INSULIN 16.2 11/17/2019   - Refill metFORMIN (GLUCOPHAGE-XR) 500 MG 24 hr tablet; Take 1 tablet (500 mg total) by mouth daily with breakfast.  Dispense: 30 tablet; Refill: 1  2. Microcytic anemia Nutrition: Iron-rich foods include dark leafy greens, red and white meats, eggs, seafood, and beans.  Certain foods and drinks prevent your body from absorbing iron properly. Avoid eating these foods in the same meal as iron-rich foods or with iron supplements. These foods include: coffee, black tea, and red wine; milk, dairy products, and foods that are high in calcium; beans and soybeans; whole grains. Constipation can be a side effect of iron supplementation. Increased water and fiber intake are helpful. Water goal: > 2 liters/day. Fiber goal: > 25  grams/day.  CBC Latest Ref Rng & Units 03/25/2021 11/28/2020 11/26/2020  WBC 3.4 - 10.8 x10E3/uL 5.6 11.5(H) 9.3  Hemoglobin 11.1 - 15.9 g/dL 10.3(L) 7.7(L) 10.0(L)  Hematocrit 34.0 - 46.6 % 35.2 24.6(L) 33.7(L)  Platelets 150 - 450 x10E3/uL 272 155 157   Lab Results  Component Value Date   IRON 33 03/25/2021   TIBC 340 03/25/2021   FERRITIN 20 03/25/2021   Lab Results  Component Value Date   VITAMINB12 267 03/25/2021   3. B12 deficiency Lab Results  Component Value Date   VITAMINB12 267 03/25/2021   Supplementation: OTC vitamin B12.   Plan:  Continue current treatment.   4. Obesity, current BMI 50.1  Course: [name] is currently in the action stage of change. As such, her goal is to continue with weight loss efforts.   Nutrition goals: She has agreed to the Category 2 Plan.   Exercise goals: For substantial health benefits, adults should do at least 150 minutes (2 hours and 30 minutes) a week of moderate-intensity, or 75 minutes (1 hour and 15 minutes) a week of vigorous-intensity aerobic physical activity, or an equivalent combination of moderate- and vigorous-intensity aerobic activity. Aerobic activity should be performed in episodes of at least 10 minutes, and preferably, it should be spread throughout the week.  Behavioral modification strategies: increasing lean protein intake, decreasing simple carbohydrates, increasing vegetables, increasing water intake, and decreasing liquid calories.  [name] has agreed to follow-up with our clinic in 4 weeks. She was informed of the importance of  frequent follow-up visits to maximize her success with intensive lifestyle modifications for her multiple health conditions.   Objective:   Blood pressure 137/72, pulse 95, temperature 98.2 F (36.8 C), temperature source Oral, height 5\' 1"  (1.549 m), weight 265 lb (120.2 kg), SpO2 96 %, unknown if currently breastfeeding. Body mass index is 50.07 kg/m.  General: Cooperative, alert,  well developed, in no acute distress. HEENT: Conjunctivae and lids unremarkable. Cardiovascular: Regular rhythm.  Lungs: Normal work of breathing. Neurologic: No focal deficits.   Lab Results  Component Value Date   CREATININE 0.70 03/25/2021   BUN 10 03/25/2021   NA 141 03/25/2021   K 4.1 03/25/2021   CL 105 03/25/2021   CO2 24 03/25/2021   Lab Results  Component Value Date   ALT 7 03/25/2021   AST 17 03/25/2021   ALKPHOS 98 03/25/2021   BILITOT 0.4 03/25/2021   Lab Results  Component Value Date   HGBA1C 4.7 (L) 03/25/2021   HGBA1C 4.9 11/17/2019   Lab Results  Component Value Date   INSULIN 10.0 03/25/2021   INSULIN 16.2 11/17/2019   Lab Results  Component Value Date   TSH 0.854 11/17/2019   Lab Results  Component Value Date   CHOL 167 11/17/2019   HDL 53 11/17/2019   LDLCALC 104 (H) 11/17/2019   TRIG 51 11/17/2019   Lab Results  Component Value Date   VD25OH 11.2 (L) 11/17/2019   Lab Results  Component Value Date   WBC 5.6 03/25/2021   HGB 10.3 (L) 03/25/2021   HCT 35.2 03/25/2021   MCV 78 (L) 03/25/2021   PLT 272 03/25/2021   Lab Results  Component Value Date   IRON 33 03/25/2021   TIBC 340 03/25/2021   FERRITIN 20 03/25/2021   Attestation Statements:   Reviewed by clinician on day of visit: allergies, medications, problem list, medical history, surgical history, family history, social history, and previous encounter notes.  Time spent on visit including pre-visit chart review and post-visit care and charting was 45 minutes.   I, 03/27/2021, CMA, am acting as transcriptionist for Insurance claims handler, DO  I have reviewed the above documentation for accuracy and completeness, and I agree with the above. -  Helane Rima, DO, MS, FAAFP, DABOM - Family and Bariatric Medicine.

## 2021-09-17 ENCOUNTER — Telehealth (INDEPENDENT_AMBULATORY_CARE_PROVIDER_SITE_OTHER): Payer: Managed Care, Other (non HMO) | Admitting: Family Medicine

## 2021-09-17 ENCOUNTER — Other Ambulatory Visit (INDEPENDENT_AMBULATORY_CARE_PROVIDER_SITE_OTHER): Payer: Self-pay

## 2021-09-17 DIAGNOSIS — R7303 Prediabetes: Secondary | ICD-10-CM

## 2021-09-17 MED ORDER — METFORMIN HCL ER 500 MG PO TB24: 500.0000 mg | ORAL_TABLET | Freq: Every day | ORAL | 1 refills | Status: DC

## 2021-10-07 ENCOUNTER — Other Ambulatory Visit: Payer: Self-pay

## 2021-10-07 ENCOUNTER — Ambulatory Visit (INDEPENDENT_AMBULATORY_CARE_PROVIDER_SITE_OTHER): Payer: Managed Care, Other (non HMO) | Admitting: Family Medicine

## 2021-10-07 ENCOUNTER — Encounter (INDEPENDENT_AMBULATORY_CARE_PROVIDER_SITE_OTHER): Payer: Self-pay | Admitting: Family Medicine

## 2021-10-07 VITALS — BP 117/72 | HR 64 | Temp 97.8°F | Ht 61.0 in | Wt 262.0 lb

## 2021-10-07 DIAGNOSIS — F3289 Other specified depressive episodes: Secondary | ICD-10-CM | POA: Diagnosis not present

## 2021-10-07 DIAGNOSIS — E538 Deficiency of other specified B group vitamins: Secondary | ICD-10-CM

## 2021-10-07 DIAGNOSIS — R7303 Prediabetes: Secondary | ICD-10-CM

## 2021-10-07 DIAGNOSIS — Z6841 Body Mass Index (BMI) 40.0 and over, adult: Secondary | ICD-10-CM

## 2021-10-08 MED ORDER — METFORMIN HCL ER 500 MG PO TB24: ORAL_TABLET | ORAL | 1 refills | Status: DC

## 2021-10-08 NOTE — Progress Notes (Signed)
Chief Complaint:   OBESITY Megan Ball is here to discuss her progress with her obesity treatment plan along with follow-up of her obesity related diagnoses. See Medical Weight Management Flowsheet for complete bioelectrical impedance results.  Today's visit was #: 17 Starting weight: 243 lbs Starting date: 11/17/2019 Weight change since last visit: 3 lbs Total lbs lost to date: +19 lbs  Nutrition Plan: Category 2 Plan for 50% of the time.  Activity: Walking for 20 minutes 2 times per week.   Assessment/Plan:   1. Prediabetes Goal is HgbA1c < 5.7.  Medication: metformin 500 mg daily with breakfast.    Plan:  Increase metformin to 1,000 mg daily with breakfast for 1 month, then increase to 1,500 mg daily with breakfast. She will continue to focus on protein-rich, low simple carbohydrate foods. We reviewed the importance of hydration, regular exercise for stress reduction, and restorative sleep.   Lab Results  Component Value Date   HGBA1C 4.7 (L) 03/25/2021   Lab Results  Component Value Date   INSULIN 10.0 03/25/2021   INSULIN 16.2 11/17/2019   - Increase metFORMIN (GLUCOPHAGE-XR) 500 MG 24 hr tablet; Take 2 tablets (1,000 mg total) by mouth daily with breakfast for 30 days, THEN 3 tablets (1,500 mg total) daily with breakfast.  Dispense: 60 tablet; Refill: 1  2. B12 deficiency Lab Results  Component Value Date   VITAMINB12 267 03/25/2021   Supplementation: OTC vitamin B12 supplement.  Plan:  Continue current B12 supplement.  3. Other depression, with emotional eating  Controlled. Medication: None.   Plan: Discussed cues and consequences, how thoughts affect eating, model of thoughts, feelings, and behaviors, and strategies for change by focusing on the cue. Discussed cognitive distortions, coping thoughts, and how to change your thoughts.  4. Obesity, current BMI 49.53  Course: Megan Ball is currently in the action stage of change. As such, her goal is to continue with  weight loss efforts.   Nutrition goals: She has agreed to the Category 2 Plan.   Exercise goals: For substantial health benefits, adults should do at least 150 minutes (2 hours and 30 minutes) a week of moderate-intensity, or 75 minutes (1 hour and 15 minutes) a week of vigorous-intensity aerobic physical activity, or an equivalent combination of moderate- and vigorous-intensity aerobic activity. Aerobic activity should be performed in episodes of at least 10 minutes, and preferably, it should be spread throughout the week.  Behavioral modification strategies: increasing lean protein intake, decreasing simple carbohydrates, increasing vegetables, and increasing water intake.  Megan Ball has agreed to follow-up with our clinic in 4 weeks. She was informed of the importance of frequent follow-up visits to maximize her success with intensive lifestyle modifications for her multiple health conditions.   Objective:   Blood pressure 117/72, pulse 64, temperature 97.8 F (36.6 C), height 5\' 1"  (1.549 m), weight 262 lb (118.8 kg), SpO2 99 %, unknown if currently breastfeeding. Body mass index is 49.5 kg/m.  General: Cooperative, alert, well developed, in no acute distress. HEENT: Conjunctivae and lids unremarkable. Cardiovascular: Regular rhythm.  Lungs: Normal work of breathing. Neurologic: No focal deficits.   Lab Results  Component Value Date   CREATININE 0.70 03/25/2021   BUN 10 03/25/2021   NA 141 03/25/2021   K 4.1 03/25/2021   CL 105 03/25/2021   CO2 24 03/25/2021   Lab Results  Component Value Date   ALT 7 03/25/2021   AST 17 03/25/2021   ALKPHOS 98 03/25/2021   BILITOT 0.4 03/25/2021  Lab Results  Component Value Date   HGBA1C 4.7 (L) 03/25/2021   HGBA1C 4.9 11/17/2019   Lab Results  Component Value Date   INSULIN 10.0 03/25/2021   INSULIN 16.2 11/17/2019   Lab Results  Component Value Date   TSH 0.854 11/17/2019   Lab Results  Component Value Date   CHOL 167  11/17/2019   HDL 53 11/17/2019   LDLCALC 104 (H) 11/17/2019   TRIG 51 11/17/2019   Lab Results  Component Value Date   VD25OH 11.2 (L) 11/17/2019   Lab Results  Component Value Date   WBC 5.6 03/25/2021   HGB 10.3 (L) 03/25/2021   HCT 35.2 03/25/2021   MCV 78 (L) 03/25/2021   PLT 272 03/25/2021   Lab Results  Component Value Date   IRON 33 03/25/2021   TIBC 340 03/25/2021   FERRITIN 20 03/25/2021   Attestation Statements:   Reviewed by clinician on day of visit: allergies, medications, problem list, medical history, surgical history, family history, social history, and previous encounter notes.  I, Insurance claims handler, CMA, am acting as transcriptionist for Helane Rima, DO  I have reviewed the above documentation for accuracy and completeness, and I agree with the above. -  Helane Rima, DO, MS, FAAFP, DABOM - Family and Bariatric Medicine.

## 2021-11-18 ENCOUNTER — Ambulatory Visit (INDEPENDENT_AMBULATORY_CARE_PROVIDER_SITE_OTHER): Payer: Managed Care, Other (non HMO) | Admitting: Family Medicine

## 2021-12-23 ENCOUNTER — Encounter (INDEPENDENT_AMBULATORY_CARE_PROVIDER_SITE_OTHER): Payer: Self-pay | Admitting: Family Medicine

## 2021-12-23 ENCOUNTER — Ambulatory Visit (INDEPENDENT_AMBULATORY_CARE_PROVIDER_SITE_OTHER): Payer: Medicaid Other | Admitting: Family Medicine

## 2021-12-23 ENCOUNTER — Other Ambulatory Visit: Payer: Self-pay

## 2021-12-23 VITALS — BP 123/72 | HR 78 | Temp 98.0°F | Ht 61.0 in | Wt 261.0 lb

## 2021-12-23 DIAGNOSIS — Z6841 Body Mass Index (BMI) 40.0 and over, adult: Secondary | ICD-10-CM | POA: Diagnosis not present

## 2021-12-23 DIAGNOSIS — R7303 Prediabetes: Secondary | ICD-10-CM | POA: Diagnosis not present

## 2021-12-23 DIAGNOSIS — F3289 Other specified depressive episodes: Secondary | ICD-10-CM | POA: Diagnosis not present

## 2021-12-23 DIAGNOSIS — E669 Obesity, unspecified: Secondary | ICD-10-CM | POA: Diagnosis not present

## 2021-12-23 DIAGNOSIS — E66813 Obesity, class 3: Secondary | ICD-10-CM

## 2021-12-23 MED ORDER — METFORMIN HCL ER 500 MG PO TB24: 1000.0000 mg | ORAL_TABLET | Freq: Every day | ORAL | 1 refills | Status: DC

## 2021-12-24 NOTE — Progress Notes (Signed)
Chief Complaint:   OBESITY Megan Ball is here to discuss her progress with her obesity treatment plan along with follow-up of her obesity related diagnoses. See Medical Weight Management Flowsheet for complete bioelectrical impedance results.  Today's visit was #: 18 Starting weight: 243 lbs Starting date: 11/17/2019 Weight change since last visit: 1 lb Total lbs lost to date: +18 lbs  Nutrition Plan: Category 1 Plan for 50% of the time.  Activity: Martial arts for 40 minutes 2 times per week.  Interim History: Megan Ball has a toddler girl and an 1-year-old boy.  She says she has been journaling/logging.  She is considering a meal prep service.  She says that her partner is thinking about fasting.  Assessment/Plan:   1. Prediabetes Controlled. Goal is HgbA1c < 5.7.  Medication: metformin XR 1000 mg daily.    Plan: Continue metformin XR 1000 mg daily.  Will refill today.  She will continue to focus on protein-rich, low simple carbohydrate foods. We reviewed the importance of hydration, regular exercise for stress reduction, and restorative sleep.   Lab Results  Component Value Date   HGBA1C 4.7 (L) 03/25/2021   Lab Results  Component Value Date   INSULIN 10.0 03/25/2021   INSULIN 16.2 11/17/2019   - Refill metFORMIN (GLUCOPHAGE-XR) 500 MG 24 hr tablet; Take 2 tablets (1,000 mg total) by mouth daily with breakfast.  Dispense: 60 tablet; Refill: 1  2. Other depression, with emotional eating  Controlled. Medication: None.    Plan: Discussed cues and consequences, how thoughts affect eating, model of thoughts, feelings, and behaviors, and strategies for change by focusing on the cue. Discussed cognitive distortions, coping thoughts, and how to change your thoughts.  3. Obesity, current BMI 49.4  Course: Megan Ball is currently in the action stage of change. As such, her goal is to continue with weight loss efforts.   Nutrition goals: She has agreed to keeping a food journal and  adhering to recommended goals of 1000-1200 calories and 85 grams of protein.   Exercise goals:  As is.  Behavioral modification strategies: increasing lean protein intake, decreasing simple carbohydrates, increasing vegetables, increasing water intake, decreasing liquid calories, and decreasing alcohol intake.  Megan Ball has agreed to follow-up with our clinic in 4 weeks. She was informed of the importance of frequent follow-up visits to maximize her success with intensive lifestyle modifications for her multiple health conditions.   Objective:   Blood pressure 123/72, pulse 78, temperature 98 F (36.7 C), temperature source Oral, height 5\' 1"  (1.549 m), weight 261 lb (118.4 kg), SpO2 98 %, unknown if currently breastfeeding. Body mass index is 49.32 kg/m.  General: Cooperative, alert, well developed, in no acute distress. HEENT: Conjunctivae and lids unremarkable. Cardiovascular: Regular rhythm.  Lungs: Normal work of breathing. Neurologic: No focal deficits.   Lab Results  Component Value Date   CREATININE 0.70 03/25/2021   BUN 10 03/25/2021   NA 141 03/25/2021   K 4.1 03/25/2021   CL 105 03/25/2021   CO2 24 03/25/2021   Lab Results  Component Value Date   ALT 7 03/25/2021   AST 17 03/25/2021   ALKPHOS 98 03/25/2021   BILITOT 0.4 03/25/2021   Lab Results  Component Value Date   HGBA1C 4.7 (L) 03/25/2021   HGBA1C 4.9 11/17/2019   Lab Results  Component Value Date   INSULIN 10.0 03/25/2021   INSULIN 16.2 11/17/2019   Lab Results  Component Value Date   TSH 0.854 11/17/2019   Lab Results  Component Value Date   CHOL 167 11/17/2019   HDL 53 11/17/2019   LDLCALC 104 (H) 11/17/2019   TRIG 51 11/17/2019   Lab Results  Component Value Date   VD25OH 11.2 (L) 11/17/2019   Lab Results  Component Value Date   WBC 5.6 03/25/2021   HGB 10.3 (L) 03/25/2021   HCT 35.2 03/25/2021   MCV 78 (L) 03/25/2021   PLT 272 03/25/2021   Lab Results  Component Value Date    IRON 33 03/25/2021   TIBC 340 03/25/2021   FERRITIN 20 03/25/2021   Attestation Statements:   Reviewed by clinician on day of visit: allergies, medications, problem list, medical history, surgical history, family history, social history, and previous encounter notes.  I, Insurance claims handler, CMA, am acting as transcriptionist for Helane Rima, DO  I have reviewed the above documentation for accuracy and completeness, and I agree with the above. -  Helane Rima, DO, MS, FAAFP, DABOM - Family and Bariatric Medicine.

## 2022-01-23 IMAGING — US US OB COMP LESS 14 WK
1 series · 13 of 28 positions shown · non-contrast
Comparison: None.

CLINICAL DATA: Vaginal spotting today. Gestational age by last
menstrual period is 6 weeks 0 days. Beta HCG is 117.

EXAM:
OBSTETRIC <14 WK ULTRASOUND; TRANSVAGINAL OB ULTRASOUND
TECHNIQUE: Transvaginal ultrasound was performed for complete evaluation of the
gestation as well as the maternal uterus, adnexal regions, and
pelvic cul-de-sac.

[Series 1: us ob comp less 14 wk · 48 acquisitions, 13 frames shown]
[im 2/48]
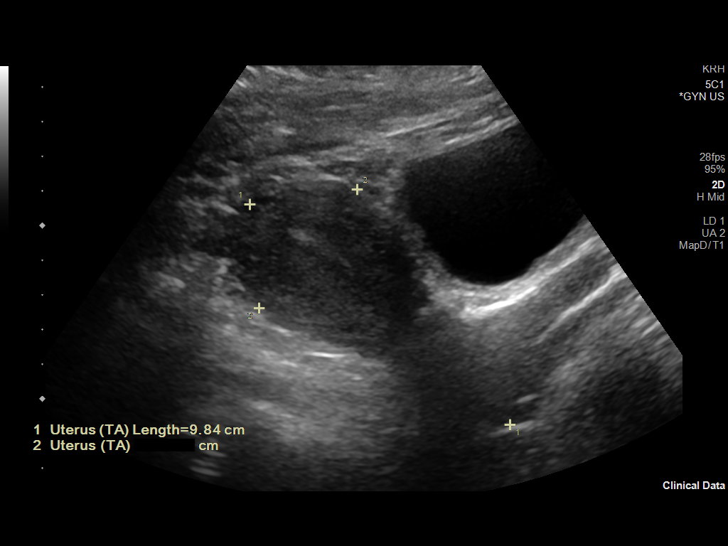
[im 6/48]
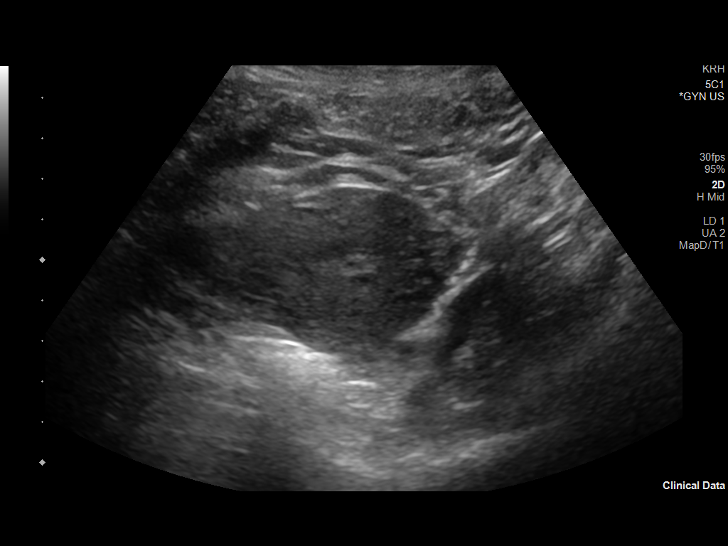
[im 9/48]
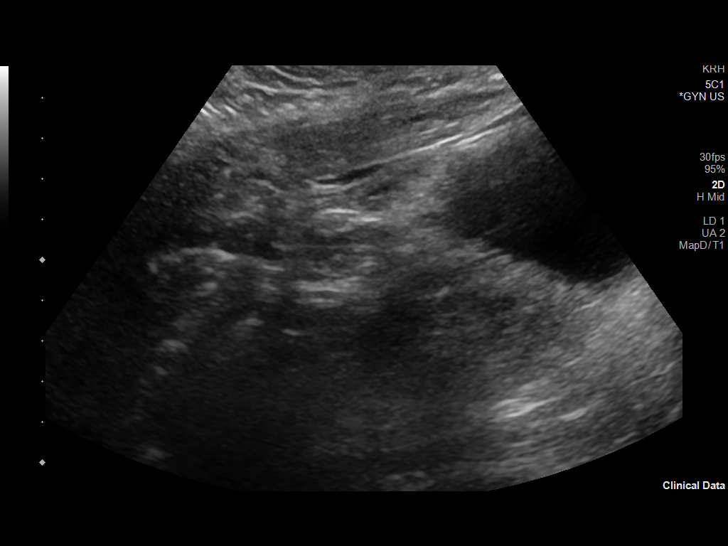
[im 13/48]
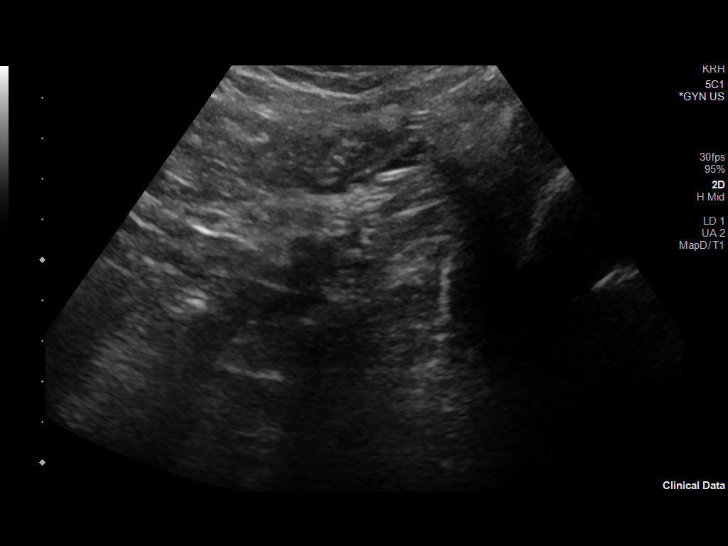
[im 16/48]
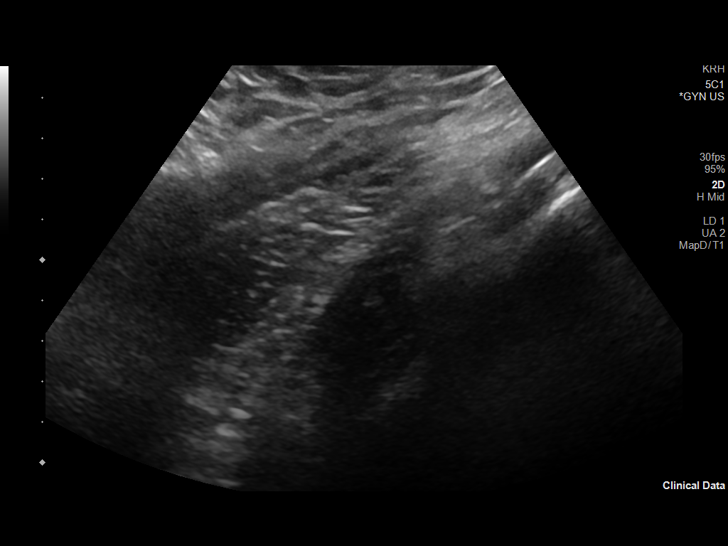
[im 20/48]
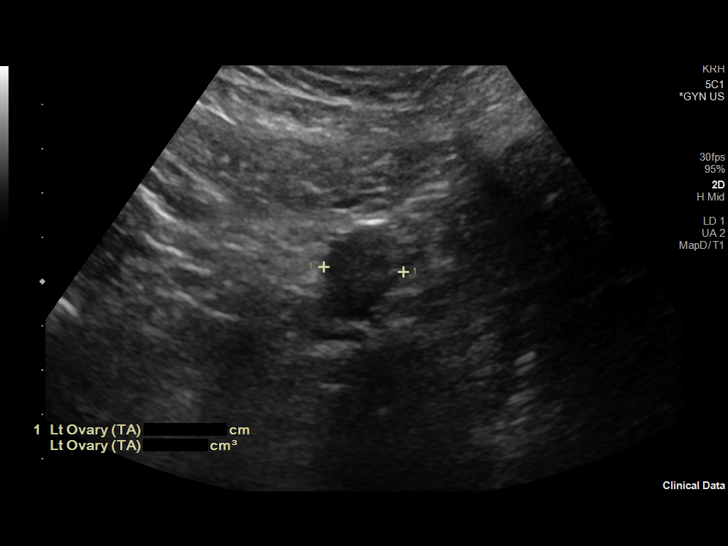
[im 25/48]
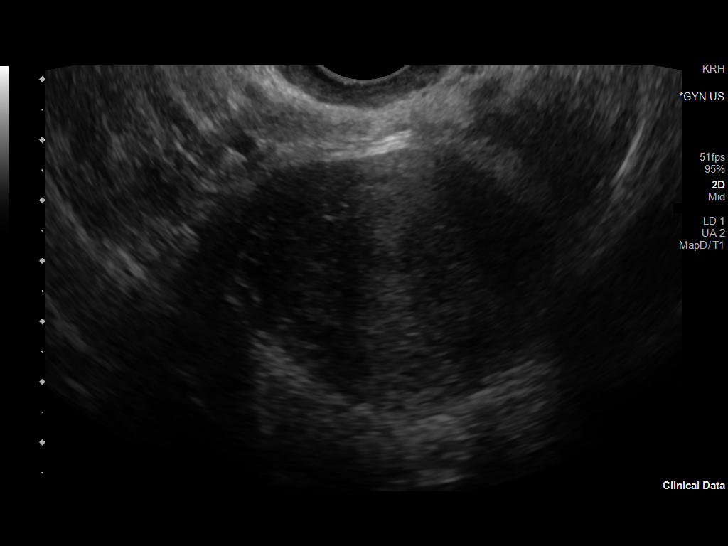
[im 28/48]
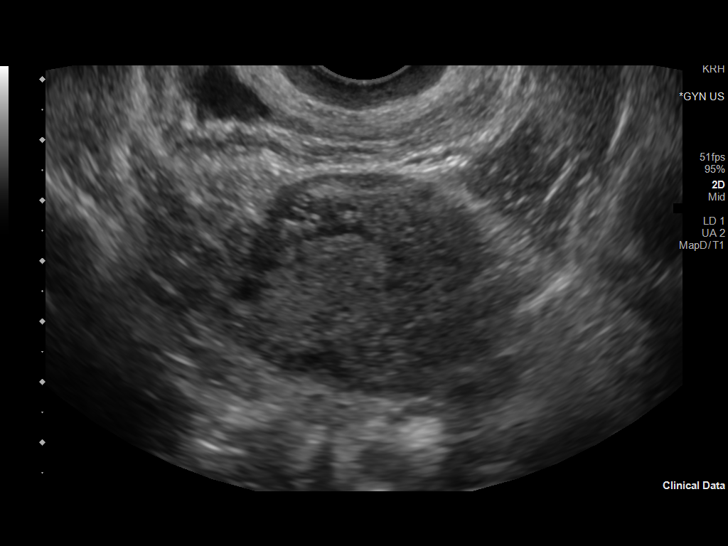
[im 32/48]
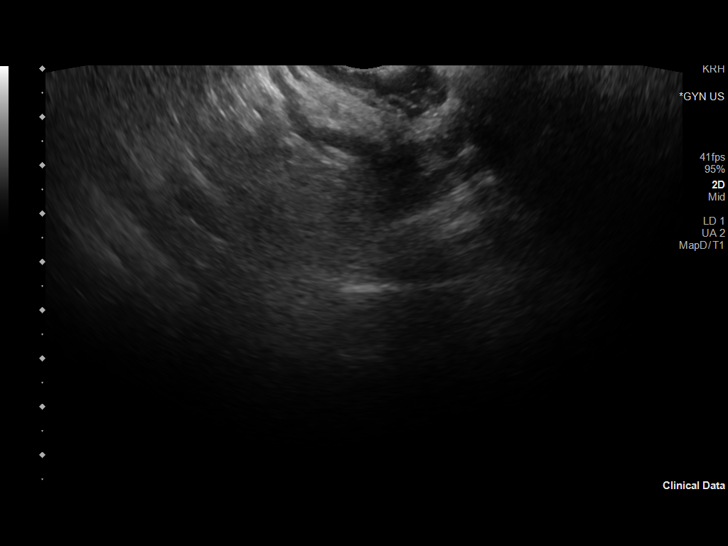
[im 35/48]
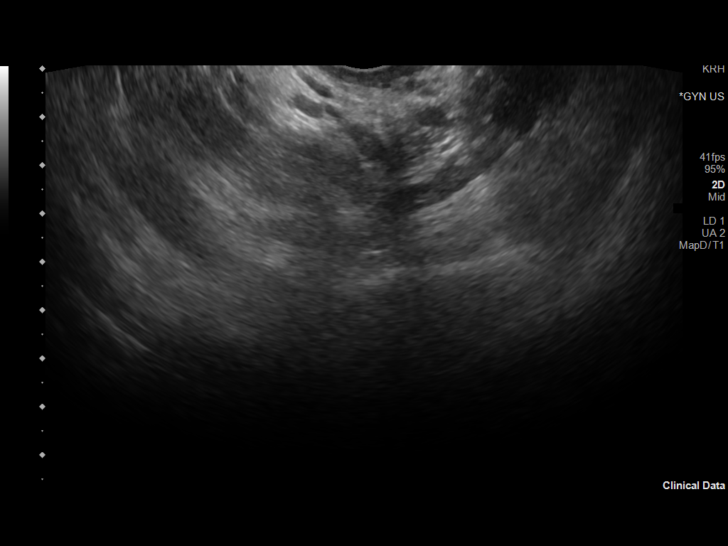
[im 39/48]
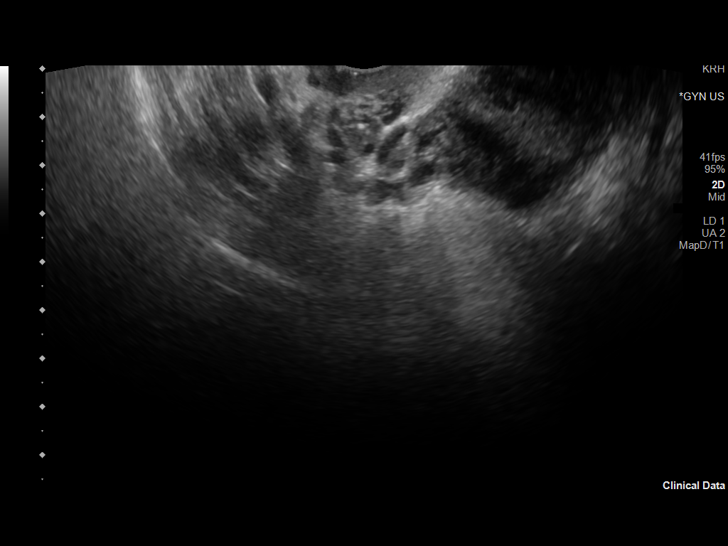
[im 42/48]
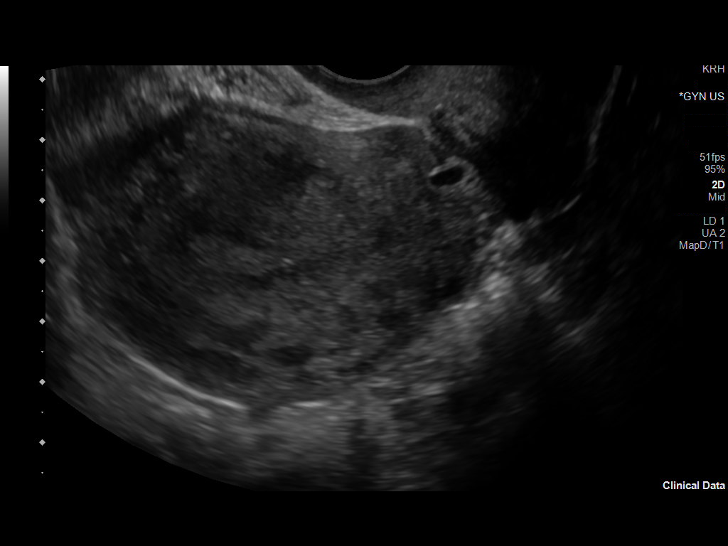
[im 46/48]
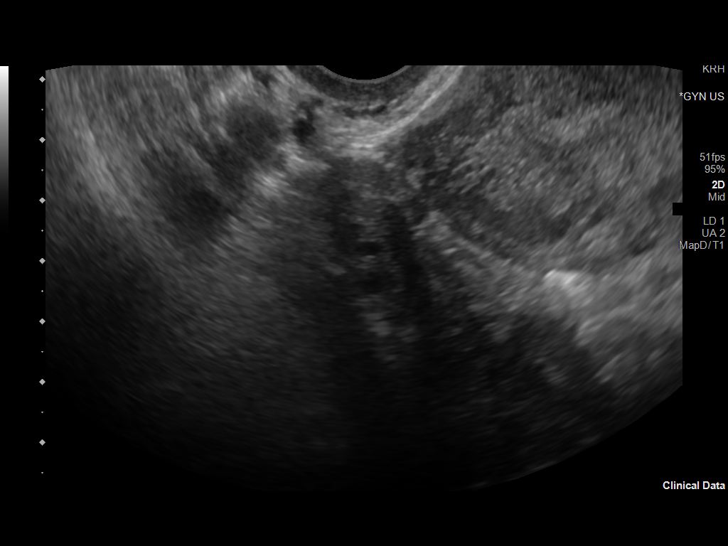

[13 of 28 positions shown; findings below may reference images not displayed]

FINDINGS: Intrauterine gestational sac: No definite gestational sac is
identified.

Yolk sac:  Not Visualized.

Embryo:  Not Visualized.

Cardiac Activity: Not Visualized.

Subchorionic hemorrhage:  None visualized.

Maternal uterus/adnexae: A small amount of fluid is seen in the
cervical canal. Trace free fluid is noted.
IMPRESSION: Small amount of fluid in the cervical canal and no definite
intrauterine gestational sac. Given that gestational age by last
menstrual period is 6 weeks, these findings are suspicious but not
yet definitive for failed pregnancy. Recommend follow-up US in 10-14
days for definitive diagnosis. This recommendation follows [facility]
consensus guidelines: Diagnostic Criteria for Nonviable Pregnancy
Early in the First Trimester. N Engl J Med 2345; [DATE].

These results were called by telephone at the time of interpretation
on 02/04/2020 at [DATE] to provider FRANK-LEANDER SEVERJANEN , who verbally
acknowledged these results.

## 2022-01-27 ENCOUNTER — Other Ambulatory Visit: Payer: Self-pay

## 2022-01-27 ENCOUNTER — Encounter (INDEPENDENT_AMBULATORY_CARE_PROVIDER_SITE_OTHER): Payer: Self-pay | Admitting: Family Medicine

## 2022-01-27 ENCOUNTER — Ambulatory Visit (INDEPENDENT_AMBULATORY_CARE_PROVIDER_SITE_OTHER): Payer: Medicaid Other | Admitting: Family Medicine

## 2022-01-27 VITALS — BP 128/73 | HR 81 | Temp 97.9°F | Ht 61.0 in | Wt 264.0 lb

## 2022-01-27 DIAGNOSIS — R7303 Prediabetes: Secondary | ICD-10-CM

## 2022-01-27 DIAGNOSIS — E559 Vitamin D deficiency, unspecified: Secondary | ICD-10-CM

## 2022-01-27 DIAGNOSIS — E669 Obesity, unspecified: Secondary | ICD-10-CM

## 2022-01-27 DIAGNOSIS — D508 Other iron deficiency anemias: Secondary | ICD-10-CM | POA: Diagnosis not present

## 2022-01-27 DIAGNOSIS — D509 Iron deficiency anemia, unspecified: Secondary | ICD-10-CM | POA: Diagnosis not present

## 2022-01-27 DIAGNOSIS — Z6841 Body Mass Index (BMI) 40.0 and over, adult: Secondary | ICD-10-CM

## 2022-01-27 DIAGNOSIS — E538 Deficiency of other specified B group vitamins: Secondary | ICD-10-CM

## 2022-01-28 LAB — VITAMIN D 25 HYDROXY (VIT D DEFICIENCY, FRACTURES): Vit D, 25-Hydroxy: 13.2 ng/mL — ABNORMAL LOW (ref 30.0–100.0)

## 2022-01-28 LAB — CBC WITH DIFFERENTIAL/PLATELET
Basophils Absolute: 0 10*3/uL (ref 0.0–0.2)
Basos: 0 %
EOS (ABSOLUTE): 0.1 10*3/uL (ref 0.0–0.4)
Eos: 2 %
Hematocrit: 36.1 % (ref 34.0–46.6)
Hemoglobin: 10.7 g/dL — ABNORMAL LOW (ref 11.1–15.9)
Immature Grans (Abs): 0 10*3/uL (ref 0.0–0.1)
Immature Granulocytes: 0 %
Lymphocytes Absolute: 2.2 10*3/uL (ref 0.7–3.1)
Lymphs: 30 %
MCH: 22.2 pg — ABNORMAL LOW (ref 26.6–33.0)
MCHC: 29.6 g/dL — ABNORMAL LOW (ref 31.5–35.7)
MCV: 75 fL — ABNORMAL LOW (ref 79–97)
Monocytes Absolute: 0.5 10*3/uL (ref 0.1–0.9)
Monocytes: 6 %
Neutrophils Absolute: 4.4 10*3/uL (ref 1.4–7.0)
Neutrophils: 62 %
Platelets: 304 10*3/uL (ref 150–450)
RBC: 4.81 x10E6/uL (ref 3.77–5.28)
RDW: 13.3 % (ref 11.7–15.4)
WBC: 7.2 10*3/uL (ref 3.4–10.8)

## 2022-01-28 LAB — COMPREHENSIVE METABOLIC PANEL
ALT: 11 IU/L (ref 0–32)
AST: 11 IU/L (ref 0–40)
Albumin/Globulin Ratio: 1.4 (ref 1.2–2.2)
Albumin: 4.1 g/dL (ref 3.9–5.0)
Alkaline Phosphatase: 95 IU/L (ref 44–121)
BUN/Creatinine Ratio: 11 (ref 9–23)
BUN: 12 mg/dL (ref 6–20)
Bilirubin Total: 0.4 mg/dL (ref 0.0–1.2)
CO2: 25 mmol/L (ref 20–29)
Calcium: 9.3 mg/dL (ref 8.7–10.2)
Chloride: 101 mmol/L (ref 96–106)
Creatinine, Ser: 1.11 mg/dL — ABNORMAL HIGH (ref 0.57–1.00)
Globulin, Total: 2.9 g/dL (ref 1.5–4.5)
Glucose: 90 mg/dL (ref 70–99)
Potassium: 4.3 mmol/L (ref 3.5–5.2)
Sodium: 139 mmol/L (ref 134–144)
Total Protein: 7 g/dL (ref 6.0–8.5)
eGFR: 69 mL/min/{1.73_m2} (ref >59)

## 2022-01-28 LAB — IRON,TIBC AND FERRITIN PANEL
Ferritin: 23 ng/mL (ref 15–150)
Iron Saturation: 23 % (ref 15–55)
Iron: 72 ug/dL (ref 27–159)
Total Iron Binding Capacity: 317 ug/dL (ref 250–450)
UIBC: 245 ug/dL (ref 131–425)

## 2022-01-28 LAB — VITAMIN B12: Vitamin B-12: 293 pg/mL (ref 232–1245)

## 2022-02-04 NOTE — Progress Notes (Signed)
Chief Complaint:   OBESITY Megan Ball is here to discuss her progress with her obesity treatment plan along with follow-up of her obesity related diagnoses. See Medical Weight Management Flowsheet for complete bioelectrical impedance results.  Today's visit was #: 19 Starting weight: 243 lbs Starting date: 11/17/2019 Weight change since last visit: +3 lbs Total lbs lost to date: +21 lbs  Nutrition Plan: Keeping a food journal and adhering to recommended goals of 1000-1200 calories and 85 grams of protein daily for 50% of the time. Activity: Walking for 20 minutes 2-3 times per week.  Interim History: Megan Ball says she is taking B12.  She has not been focused, she says.  She is ready for a restart.  Assessment/Plan:   1. B12 deficiency Lab Results  Component Value Date   VITAMINB12 293 01/27/2022   Supplementation: OTC vitamin B12 daily.     - Vitamin B12  2. Other iron deficiency anemia Nutrition: Iron-rich foods include dark leafy greens, red and white meats, eggs, seafood, and beans.  Certain foods and drinks prevent your body from absorbing iron properly. Avoid eating these foods in the same meal as iron-rich foods or with iron supplements. These foods include: coffee, black tea, and red wine; milk, dairy products, and foods that are high in calcium; beans and soybeans; whole grains. Constipation can be a side effect of iron supplementation. Increased water and fiber intake are helpful. Water goal: > 2 liters/day. Fiber goal: > 25 grams/day.  Plan:  Will check labs today.  - CBC with Differential/Platelet - Iron, TIBC and Ferritin Panel  3. Prediabetes Controlled. Goal is HgbA1c < 5.7.  Medication: metformin 1000 mg daily.    Plan:  Continue metformin 1000 mg daily.  She will continue to focus on protein-rich, low simple carbohydrate foods. We reviewed the importance of hydration, regular exercise for stress reduction, and restorative sleep. Will check CMP today.  Lab  Results  Component Value Date   HGBA1C 4.7 (L) 03/25/2021   Lab Results  Component Value Date   INSULIN 10.0 03/25/2021   INSULIN 16.2 11/17/2019   - Comprehensive metabolic panel  4. Vitamin D deficiency Not at goal. She is taking vitamin D 50,000 IU weekly.  Plan: Continue to take prescription Vitamin D @50 ,000 IU every week as prescribed.  Follow-up for routine testing of Vitamin D, at least 2-3 times per year to avoid over-replacement.  Lab Results  Component Value Date   VD25OH 13.2 (L) 01/27/2022   VD25OH 11.2 (L) 11/17/2019   - VITAMIN D 25 Hydroxy (Vit-D Deficiency, Fractures)  5. Obesity, current BMI 49.9  Course: Eleisha is currently in the action stage of change. As such, her goal is to continue with weight loss efforts.   Nutrition goals: She has agreed to keeping a food journal and adhering to recommended goals of 1000-1200 calories and 85 grams of protein.   Exercise goals:  As is.  Behavioral modification strategies: increasing lean protein intake, decreasing simple carbohydrates, and increasing vegetables.  [name] has agreed to follow-up with our clinic in 3 weeks. She was informed of the importance of frequent follow-up visits to maximize her success with intensive lifestyle modifications for her multiple health conditions.   Objective:   Blood pressure 128/73, pulse 81, temperature 97.9 F (36.6 C), temperature source Oral, height 5\' 1"  (1.549 m), weight 264 lb (119.7 kg), SpO2 96 %, unknown if currently breastfeeding. Body mass index is 49.88 kg/m.  General: Cooperative, alert, well developed, in no acute  distress. HEENT: Conjunctivae and lids unremarkable. Cardiovascular: Regular rhythm.  Lungs: Normal work of breathing. Neurologic: No focal deficits.   Lab Results  Component Value Date   CREATININE 1.11 (H) 01/27/2022   BUN 12 01/27/2022   NA 139 01/27/2022   K 4.3 01/27/2022   CL 101 01/27/2022   CO2 25 01/27/2022   Lab Results   Component Value Date   ALT 11 01/27/2022   AST 11 01/27/2022   ALKPHOS 95 01/27/2022   BILITOT 0.4 01/27/2022   Lab Results  Component Value Date   HGBA1C 4.7 (L) 03/25/2021   HGBA1C 4.9 11/17/2019   Lab Results  Component Value Date   INSULIN 10.0 03/25/2021   INSULIN 16.2 11/17/2019   Lab Results  Component Value Date   TSH 0.854 11/17/2019   Lab Results  Component Value Date   CHOL 167 11/17/2019   HDL 53 11/17/2019   LDLCALC 104 (H) 11/17/2019   TRIG 51 11/17/2019   Lab Results  Component Value Date   VD25OH 13.2 (L) 01/27/2022   VD25OH 11.2 (L) 11/17/2019   Lab Results  Component Value Date   WBC 7.2 01/27/2022   HGB 10.7 (L) 01/27/2022   HCT 36.1 01/27/2022   MCV 75 (L) 01/27/2022   PLT 304 01/27/2022   Lab Results  Component Value Date   IRON 72 01/27/2022   TIBC 317 01/27/2022   FERRITIN 23 01/27/2022   Attestation Statements:   Reviewed by clinician on day of visit: allergies, medications, problem list, medical history, surgical history, family history, social history, and previous encounter notes.  I, Insurance claims handler, CMA, am acting as transcriptionist for Helane Rima, DO  I have reviewed the above documentation for accuracy and completeness, and I agree with the above. -  Helane Rima, DO, MS, FAAFP, DABOM - Family and Bariatric Medicine.

## 2022-06-18 ENCOUNTER — Encounter (INDEPENDENT_AMBULATORY_CARE_PROVIDER_SITE_OTHER): Payer: Self-pay

## 2022-07-28 ENCOUNTER — Other Ambulatory Visit: Payer: Self-pay

## 2022-07-28 ENCOUNTER — Ambulatory Visit (INDEPENDENT_AMBULATORY_CARE_PROVIDER_SITE_OTHER): Payer: BC Managed Care – PPO | Admitting: Plastic Surgery

## 2022-07-28 VITALS — BP 132/84 | HR 75 | Ht 61.0 in | Wt 265.0 lb

## 2022-07-28 DIAGNOSIS — N62 Hypertrophy of breast: Secondary | ICD-10-CM | POA: Diagnosis not present

## 2022-07-28 DIAGNOSIS — G8929 Other chronic pain: Secondary | ICD-10-CM

## 2022-07-28 DIAGNOSIS — M542 Cervicalgia: Secondary | ICD-10-CM | POA: Diagnosis not present

## 2022-07-28 DIAGNOSIS — M545 Low back pain, unspecified: Secondary | ICD-10-CM | POA: Diagnosis not present

## 2022-07-28 DIAGNOSIS — Z6841 Body Mass Index (BMI) 40.0 and over, adult: Secondary | ICD-10-CM

## 2022-07-28 DIAGNOSIS — M546 Pain in thoracic spine: Secondary | ICD-10-CM | POA: Diagnosis not present

## 2022-07-29 NOTE — Progress Notes (Signed)
Referring Provider Jonathon Resides, MD [address] [place],   [address]   CC:  Breast hypertrophy and back pain   Megan Ball is an 31 y.o. female.  HPI:   The patient is a 31 y.o. female with a history of mammary hyperplasia for several years.  She has extremely large breasts causing symptoms that include the following: Back pain in the upper and lower back, including neck pain. She pulls or pins her bra straps to provide better lift and relief of the pressure and pain. She notices relief by holding her breast up manually.  Her shoulder straps cause grooves and pain and pressure that requires padding for relief. Pain medication is sometimes required with motrin and tylenol.  Activities that are hindered by enlarged breasts include: exercise and running.  She has tried supportive clothing as well as fitted bras without improvement.     Mammogram history: None due to age.  Family history of breast cancer: None.  Tobacco use: None.   The patient expresses the desire to pursue surgical intervention.  The BMI = 50.  Preoperative bra size = F cup.   Allergies  Allergen Reactions   Pollen Extract Other (See Comments)    Typical Hay Fever Symptoms    Outpatient Encounter Medications as of 07/28/2022  Medication Sig   Cyanocobalamin (B-12 PO) Take by mouth.   OZEMPIC, 0.25 OR 0.5 MG/DOSE, 2 MG/3ML SOPN SMARTSIG:0.25 Milligram(s) SUB-Q Once a Week   metFORMIN (GLUCOPHAGE-XR) 500 MG 24 hr tablet Take 2 tablets (1,000 mg total) by mouth daily with breakfast.   No facility-administered encounter medications on file as of 07/28/2022.     Past Medical History:  Diagnosis Date   Anemia    Eczema    Gestational thrombocytopenia ([place]) 09/17/2013   Obesity    Postpartum care following cesarean delivery (11/7) 09/16/2013   Seasonal allergies    Shortness of breath     Past Surgical History:  Procedure Laterality Date   CESAREAN SECTION N/A 09/16/2013   Procedure: CESAREAN  SECTION;  Surgeon: Princess Bruins, MD;  Location: [facility];  Service: Obstetrics;  Laterality: N/A;   CESAREAN SECTION N/A 11/27/2020   Procedure: CESAREAN SECTION;  Surgeon: Sanjuana Kava, MD;  Location: [facility];  Service: Obstetrics;  Laterality: N/A;   TONSILLECTOMY      Family History  Problem Relation Age of Onset   Obesity Mother    High blood pressure Father    Obesity Father    Hypertension Maternal Grandmother    Cancer Maternal Grandfather        Throat Cancer    Social History   Social History Narrative   Marital Status: Single   Children:  Son Kendal Hymen)     Pets:  None    Living Situation: Lives with mother, sister, son.   Occupation:  Part-Time Systems analyst)    Education:  Programmer, systems; She is a Administrator, arts at [employer].  She is taking this semester off.  She is studying business.     Tobacco Use/Exposure:  None    Alcohol Use:  None    Drug Use:  None   Diet:  Regular   Exercise:  Sit Ups   Hobbies: Shopping                 Review of Systems General: Denies fevers, chills, weight loss CV: Denies chest pain, shortness of breath, palpitations   Physical Exam    07/28/2022    3:16 PM  01/27/2022    3:00 PM 12/23/2021   11:00 AM  Vitals with BMI  Height 5\' 1"  5\' 1"  5\' 1"   Weight 265 lbs 264 lbs 261 lbs  BMI 50.1 [id number]  Systolic [id number]  Diastolic 84 73 72  Pulse 75 81 78    General:  No acute distress,  Alert and oriented, Non-Toxic, Normal speech and affect Breast: No easily palpable breast masses on physical exam, significant breast ptosis and macromastia. Her breasts are extremely large and fairly symmetric with the right larger.  She has hyperpigmentation of the inframammary area on both sides.  The sternal to nipple distance on the right is 47 cm and the left is 42 cm.  The IMF distance is 18 cm on the right and 15 cm on the left.  Base width is 25 on the right and 24 on the left. Assessment/Plan   The patient has bilateral symptomatic  macromastia.  She is a good candidate for a breast reduction.  She is interested in pursuing surgical treatment.  She has tried supportive garments and fitted bras with no relief.  The details of breast reduction surgery were discussed.  I explained the procedure in detail along the with the expected scars.  The risks were discussed in detail and include bleeding, infection, damage to surrounding structures, need for additional procedures, nipple loss, change in nipple sensation, persistent pain, contour irregularities and asymmetries.  I explained that breast feeding is often not possible after breast reduction surgery.  We discussed the expected postoperative course with an overall recovery period of about 1 month.  She demonstrated full understanding of all risks.  We discussed her personal risk factors that include high BMI.  The patient is interested in pursuing surgical treatment.  The estimated excess breast tissue to be removed at the time of surgery = greater than 1000 to 1200  grams on the left and greater than 1000 to 1200 grams on the right. Lennice Sites 07/29/2022, 2:21 PM

## 2022-08-12 NOTE — Therapy (Signed)
OUTPATIENT PHYSICAL THERAPY THORACOLUMBAR EVALUATION   Patient Name: Megan Ball MRN: [id number] DOB:1991/07/12, 31 y.o., female Today's Date: 08/13/2022   PT End of Session - 08/13/22 1736     Visit Number 1    Number of Visits 6    Date for PT Re-Evaluation 10/08/22    Authorization Type BCBS    PT Start Time 1702    PT Stop Time 9417    PT Time Calculation (min) 28 min    Activity Tolerance Patient tolerated treatment well    Behavior During Therapy [facility] for tasks assessed/performed             Past Medical History:  Diagnosis Date   Anemia    Eczema    Gestational thrombocytopenia ([place]) 09/17/2013   Obesity    Postpartum care following cesarean delivery (11/7) 09/16/2013   Seasonal allergies    Shortness of breath    Past Surgical History:  Procedure Laterality Date   CESAREAN SECTION N/A 09/16/2013   Procedure: CESAREAN SECTION;  Surgeon: Princess Bruins, MD;  Location: [facility];  Service: Obstetrics;  Laterality: N/A;   CESAREAN SECTION N/A 11/27/2020   Procedure: CESAREAN SECTION;  Surgeon: Sanjuana Kava, MD;  Location: [facility];  Service: Obstetrics;  Laterality: N/A;   TONSILLECTOMY     Patient Active Problem List   Diagnosis Date Noted   SOB (shortness of breath) on exertion 03/25/2021   Prediabetes 03/25/2021   Microcytic anemia 11/26/2020   Morbid obesity ([place]) 11/26/2020   Previous cesarean delivery affecting pregnancy 11/26/2020   Eczema 12/04/2013    PCP: Jonathon Resides, MD  REFERRING PROVIDER: Lennice Sites, MD  REFERRING DIAG: N62 (ICD-10-CM) - Macromastia  Rationale for Evaluation and Treatment Rehabilitation  THERAPY DIAG:  Pain in thoracic spine - Plan: PT plan of care cert/re-cert  Other low back pain - Plan: PT plan of care cert/re-cert  Muscle weakness (generalized) - Plan: PT plan of care cert/re-cert  Abnormal posture - Plan: PT plan of care cert/re-cert  ONSET DATE: Chronic  SUBJECTIVE:                                                                                                                                                                                            SUBJECTIVE STATEMENT: Pt presents to PT with reports of chronic mid and lower back pain secondary to large breast size. Notes increase in breast size and pain after child birth. Has most pain with prolonged sitting and standing, frequently has to readjust her bra straps secondary to discomfort.   PERTINENT HISTORY:  None  PAIN:  Are you having pain?  Yes: NPRS scale: 7/10 Best: 7/10 Worst: 9/10 Pain location: mid and lower back  Pain description: tight, sharp Aggravating factors: lying flat, sitting Relieving factors: rest, positioning   PRECAUTIONS: None  WEIGHT BEARING RESTRICTIONS No  FALLS:  Has patient fallen in last 6 months? No  LIVING ENVIRONMENT: Lives with: lives with their family Lives in: House/apartment Stairs: No barriers Has following equipment at home: None  OCCUPATION: works from home  PLOF: Independent and Independent with basic ADLs  PATIENT GOALS: decrease back pain, improve posture   OBJECTIVE:   DIAGNOSTIC FINDINGS:  N/A  PATIENT SURVEYS:  ODI: 44% disability  COGNITION:  Overall cognitive status: Within functional limits for tasks assessed    SENSATION: WFL  POSTURE: rounded shoulders, forward head, and larger body habitus   PALPATION: TTP to thoracic paraspinals  THORACIC ROM:   Active  A/PROM  eval  Right rotation 80  Left rotation 75   (Blank rows = not tested)  UPPER EXTREMITY MMT:  MMT Right eval Left eval  Shoulder flexion    Shoulder abduction    Shoulder IR    Shoulder ER    Shoulder extension    Upper trap    Middle trap 3/5 3/5  Lower trap 3/5 3/5  Elbow flexion     Elbow extension    Wrist flexion    Wrist extension    Grip     (Blank rows = not tested)  TODAY'S TREATMENT  [facility] Adult PT Treatment:                                                DATE:  08/13/2022 Therapeutic Exercise: Bilateral ER BTB x 5 Seated horizontal abd x 10 BTB Row BTB x 10 Corner stretch x 30"  PATIENT EDUCATION:  Education details: eval findings, ODI, HEP, POC Person educated: Patient Education method: Explanation, Demonstration, and Handouts Education comprehension: verbalized understanding and returned demonstration   HOME EXERCISE PROGRAM: Access Code: [email or web address] URL: [email or web address] Date: 08/13/2022 Prepared by: Edwinna Areola  Exercises - Shoulder External Rotation and Scapular Retraction with Resistance  - 1 x daily - 7 x weekly - 3 sets - 10 reps - Standing Shoulder Horizontal Abduction with Resistance  - 1 x daily - 7 x weekly - 3 sets - 10 reps - Standing Shoulder Row with Anchored Resistance  - 1 x daily - 7 x weekly - 3 sets - 10 reps - Corner Pec Major Stretch  - 1 x daily - 7 x weekly - 2-3 reps - 20-30 sec hold  ASSESSMENT:  CLINICAL IMPRESSION: Patient is a 31 y.o. F who was seen today for physical therapy evaluation and treatment for chronic mid and lower back pain secondary to large breast size. Physical findings consistent with MD impression and subjective complaints, with decreased postural strength and thoracic rotation. Her ODI demonstrates moderate disability in the performance of home ADLs and community activities. She would benefit from skilled PT working on periscapular and core strength and improving thoracic mobility.    OBJECTIVE IMPAIRMENTS decreased endurance, decreased mobility, decreased ROM, decreased strength, postural dysfunction, and pain.   ACTIVITY LIMITATIONS sitting, standing, squatting, transfers, and dressing  PARTICIPATION LIMITATIONS: shopping, community activity, occupation, and yard work  PERSONAL FACTORS Time since onset of injury/illness/exacerbation are also affecting patient's functional outcome.   REHAB POTENTIAL: Excellent  CLINICAL DECISION MAKING:  Stable/uncomplicated  EVALUATION COMPLEXITY: Low   GOALS: Goals reviewed with patient? No  SHORT TERM GOALS: Target date: 09/03/2022  Pt will be compliant and knowledgeable with initial HEP for improved comfort and carryover Baseline: initial HEP given  Goal status: INITIAL  2.  Pt will self report back pain no greater than 6/10 for improved comfort and functional ability Baseline: 9/10 at worst Goal status: INITIAL   LONG TERM GOALS: Target date: 10/08/2022  Pt will self report back pain no greater than 2-3/10 for improved comfort and functional ability Baseline: 9/10 at worst Goal status: INITIAL   2.  Pt will decrease ODI disability score to no greater than 25% as proxy for functional improvement Baseline: 44% disability Goal status: INITIAL  3.  Pt will improve bilateral thoracic rotation to no less than 85 degrees for improved comfort and function Baseline: see chart Goal status: INITIAL  4.  Pt will increase bilateral middle/lower trap strength to no less than 4+/5 for improved comfort and postural endurance Baseline: see chart Goal status: INITIAL  PLAN: PT FREQUENCY: 1x/week  PT DURATION: 6 weeks  PLANNED INTERVENTIONS: Therapeutic exercises, Therapeutic activity, Neuromuscular re-education, Balance training, Gait training, Patient/Family education, Self Care, Joint mobilization, Dry Needling, Electrical stimulation, Cryotherapy, Moist heat, Manual therapy, and Re-evaluation.  PLAN FOR NEXT SESSION: progress thoracic mobility and periscapular/core strength   Eloy End, PT 08/13/2022, 5:38 PM

## 2022-08-13 ENCOUNTER — Ambulatory Visit: Payer: BC Managed Care – PPO | Attending: Plastic Surgery

## 2022-08-13 ENCOUNTER — Other Ambulatory Visit: Payer: Self-pay

## 2022-08-13 DIAGNOSIS — M6281 Muscle weakness (generalized): Secondary | ICD-10-CM | POA: Diagnosis present

## 2022-08-13 DIAGNOSIS — N62 Hypertrophy of breast: Secondary | ICD-10-CM | POA: Insufficient documentation

## 2022-08-13 DIAGNOSIS — M546 Pain in thoracic spine: Secondary | ICD-10-CM | POA: Insufficient documentation

## 2022-08-13 DIAGNOSIS — R293 Abnormal posture: Secondary | ICD-10-CM | POA: Diagnosis present

## 2022-08-13 DIAGNOSIS — M5459 Other low back pain: Secondary | ICD-10-CM | POA: Diagnosis present

## 2022-08-14 ENCOUNTER — Ambulatory Visit (INDEPENDENT_AMBULATORY_CARE_PROVIDER_SITE_OTHER): Payer: BC Managed Care – PPO | Admitting: Plastic Surgery

## 2022-08-14 ENCOUNTER — Encounter: Payer: Self-pay | Admitting: Plastic Surgery

## 2022-08-14 VITALS — BP 116/79 | HR 96 | Ht 61.0 in | Wt 265.8 lb

## 2022-08-14 DIAGNOSIS — M546 Pain in thoracic spine: Secondary | ICD-10-CM

## 2022-08-14 DIAGNOSIS — Z6841 Body Mass Index (BMI) 40.0 and over, adult: Secondary | ICD-10-CM | POA: Diagnosis not present

## 2022-08-14 DIAGNOSIS — M542 Cervicalgia: Secondary | ICD-10-CM

## 2022-08-14 DIAGNOSIS — N62 Hypertrophy of breast: Secondary | ICD-10-CM

## 2022-08-14 NOTE — Progress Notes (Signed)
Referring Provider No referring provider defined for this encounter.   CC:  Chief Complaint  Patient presents with   Consult      Megan Ball is an 31 y.o. female.  HPI: This is a 31 year old female who was previously seen by at Dr. Jason Fila in regards to treatment of her symptomatic  macromastia.  Patient notes that her breasts are large and pendulous and cause her significant upper back and neck pain.  She is obese with a BMI of 50.  She feels that the size of her breast while not the entire problem certainly contribute to her inability to exercise.  Allergies  Allergen Reactions   Pollen Extract Other (See Comments)    Typical Hay Fever Symptoms    Outpatient Encounter Medications as of 08/14/2022  Medication Sig   Cyanocobalamin (B-12 PO) Take by mouth.   metFORMIN (GLUCOPHAGE-XR) 500 MG 24 hr tablet Take 2 tablets (1,000 mg total) by mouth daily with breakfast.   OZEMPIC, 0.25 OR 0.5 MG/DOSE, 2 MG/3ML SOPN SMARTSIG:0.25 Milligram(s) SUB-Q Once a Week   No facility-administered encounter medications on file as of 08/14/2022.     Past Medical History:  Diagnosis Date   Anemia    Eczema    Gestational thrombocytopenia ([facility]) 09/17/2013   Obesity    Postpartum care following cesarean delivery (11/7) 09/16/2013   Seasonal allergies    Shortness of breath     Past Surgical History:  Procedure Laterality Date   CESAREAN SECTION N/A 09/16/2013   Procedure: CESAREAN SECTION;  Surgeon: Genia Del, MD;  Location: [facility];  Service: Obstetrics;  Laterality: N/A;   CESAREAN SECTION N/A 11/27/2020   Procedure: CESAREAN SECTION;  Surgeon: Essie Hart, MD;  Location: [facility];  Service: Obstetrics;  Laterality: N/A;   TONSILLECTOMY      Family History  Problem Relation Age of Onset   Obesity Mother    High blood pressure Father    Obesity Father    Hypertension Maternal Grandmother    Cancer Maternal Grandfather        Throat Cancer    Social History    Social History Narrative   Marital Status: Single   Children:  Son Janit Pagan)     Pets:  None    Living Situation: Lives with mother, sister, son.   Occupation:  Part-Time Nurse, mental health)    Education:  Engineer, agricultural; She is a Medical laboratory scientific officer at [employer].  She is taking this semester off.  She is studying business.     Tobacco Use/Exposure:  None    Alcohol Use:  None    Drug Use:  None   Diet:  Regular   Exercise:  Sit Ups   Hobbies: Shopping                 Review of Systems General: Denies fevers, chills, weight loss CV: Denies chest pain, shortness of breath, palpitations Pulmonary: Denies shortness of breath with normal daily activities.   Physical Exam    08/14/2022    9:00 AM 07/28/2022    3:16 PM 01/27/2022    3:00 PM  Vitals with BMI  Height 5\' 1"  5\' 1"  5\' 1"   Weight 265 lbs 13 oz 265 lbs 264 lbs  BMI 50.25 50.1 49.91  Systolic 116 132  Diastolic 79 84 73  Pulse 96 75 81    General:  No acute distress,  Alert and oriented, Non-Toxic, Normal speech and affect Breasts: The patient's breasts  are large and pendulous.  Grade 3 ptosis.  Assessment/Plan Symptomatic macromastia: I agree with the previous diagnosis of symptomatic macromastia.  The patient while not an ideal candidate for any surgery due to her weight would benefit from a breast reduction.  We discussed the procedure at length including the location of the scars, the unpredictable nature of scarring, the risks of seroma, hematoma, and wound complications, the possibility of difficulty with breast-feeding after the procedure, the possibility of nipple loss including the possible need for a free nipple graft in the operating room.  I discussed with her the possible use of drains the need for supportive garments postoperatively and the need for early activity to prevent pulmonary and DVT complications.  She verbalizes understanding of all of these risks and requests that I proceed with surgery. Will discuss the next  steps for insurance approval.   Megan Ball 08/14/2022, 11:19 AM

## 2022-08-19 NOTE — Therapy (Unsigned)
OUTPATIENT PHYSICAL THERAPY TREATMENT NOTE   Patient Name: Megan Ball MRN: [id number] DOB:June 19, 1991, 31 y.o., female Today's Date: 08/20/2022  PCP: Jonathon Resides, MD REFERRING PROVIDER: Lennice Sites, MD  END OF SESSION:   PT End of Session - 08/20/22 1746     Visit Number 2    Number of Visits 6    Date for PT Re-Evaluation 10/08/22    Authorization Type BCBS    PT Start Time 1745    PT Stop Time 1825    PT Time Calculation (min) 40 min    Activity Tolerance Patient tolerated treatment well    Behavior During Therapy [facility] for tasks assessed/performed             Past Medical History:  Diagnosis Date   Anemia    Eczema    Gestational thrombocytopenia ([place]) 09/17/2013   Obesity    Postpartum care following cesarean delivery (11/7) 09/16/2013   Seasonal allergies    Shortness of breath    Past Surgical History:  Procedure Laterality Date   CESAREAN SECTION N/A 09/16/2013   Procedure: CESAREAN SECTION;  Surgeon: Princess Bruins, MD;  Location: [facility];  Service: Obstetrics;  Laterality: N/A;   CESAREAN SECTION N/A 11/27/2020   Procedure: CESAREAN SECTION;  Surgeon: Sanjuana Kava, MD;  Location: [facility];  Service: Obstetrics;  Laterality: N/A;   TONSILLECTOMY     Patient Active Problem List   Diagnosis Date Noted   SOB (shortness of breath) on exertion 03/25/2021   Prediabetes 03/25/2021   Microcytic anemia 11/26/2020   Morbid obesity ([place]) 11/26/2020   Previous cesarean delivery affecting pregnancy 11/26/2020   Eczema 12/04/2013    REFERRING DIAG: N62 (ICD-10-CM) - Macromastia  THERAPY DIAG: Other low back pain - Plan: PT plan of care cert/re-cert   Muscle weakness (generalized) - Plan: PT plan of care cert/re-cert   Abnormal posture - Plan: PT plan of care cert/re-cert  Rationale for Evaluation and Treatment Rehabilitation  PERTINENT HISTORY: None  PRECAUTIONS: None  SUBJECTIVE: No changes in overall pain/discomfort, has been compliant with  HEP  PAIN:  Are you having pain? Yes: NPRS scale: 6/10 Pain location: mid back Pain description: ache Aggravating factors: prolonged positions Relieving factors: rest   OBJECTIVE: (objective measures completed at initial evaluation unless otherwise dated)  DIAGNOSTIC FINDINGS:  N/A   PATIENT SURVEYS:  ODI: 44% disability   COGNITION:           Overall cognitive status: Within functional limits for tasks assessed                 SENSATION: WFL   POSTURE: rounded shoulders, forward head, and larger body habitus    PALPATION: TTP to thoracic paraspinals   THORACIC ROM:    Active  A/PROM  eval  Right rotation 80  Left rotation 75   (Blank rows = not tested)   UPPER EXTREMITY MMT:   MMT Right eval Left eval  Shoulder flexion      Shoulder abduction      Shoulder IR      Shoulder ER      Shoulder extension      Upper trap      Middle trap 3/5 3/5  Lower trap 3/5 3/5  Elbow flexion       Elbow extension      Wrist flexion      Wrist extension      Grip       (Blank rows = not tested)  TODAY'S TREATMENT  [facility] Adult PT Treatment:                                                DATE: 08/20/22 Therapeutic Exercise: UBE L1 3/3 min Supine OH flexion 15x with inspiration Supine horizontal abduction RTB 15x Open book 10/10 Seated ER RTB 10x Seated hor abd RTB 10x Doorway stretch 30s x3 Bird dog 10/10  [facility] Adult PT Treatment:                                                DATE: 08/13/2022 Therapeutic Exercise: Bilateral ER BTB x 5 Seated horizontal abd x 10 BTB Row BTB x 10 Corner stretch x 30"   PATIENT EDUCATION:  Education details: eval findings, ODI, HEP, POC Person educated: Patient Education method: Explanation, Demonstration, and Handouts Education comprehension: verbalized understanding and returned demonstration     HOME EXERCISE PROGRAM: Access Code: [email or web address] URL: [email or web address] Date: 08/13/2022 Prepared by: Octavio Manns   Exercises - Shoulder External Rotation and Scapular Retraction with Resistance  - 1 x daily - 7 x weekly - 3 sets - 10 reps - Standing Shoulder Horizontal Abduction with Resistance  - 1 x daily - 7 x weekly - 3 sets - 10 reps - Standing Shoulder Row with Anchored Resistance  - 1 x daily - 7 x weekly - 3 sets - 10 reps - Corner Pec Major Stretch  - 1 x daily - 7 x weekly - 2-3 reps - 20-30 sec hold   ASSESSMENT:   CLINICAL IMPRESSION: No change in symptoms to report but patient is compliant with her HEP.  Today's session stressed stretching, posture, rib excursion and breathing patterns, reviewed HEP   OBJECTIVE IMPAIRMENTS decreased endurance, decreased mobility, decreased ROM, decreased strength, postural dysfunction, and pain.    ACTIVITY LIMITATIONS sitting, standing, squatting, transfers, and dressing   PARTICIPATION LIMITATIONS: shopping, community activity, occupation, and yard work   PERSONAL FACTORS Time since onset of injury/illness/exacerbation are also affecting patient's functional outcome.    REHAB POTENTIAL: Excellent   CLINICAL DECISION MAKING: Stable/uncomplicated   EVALUATION COMPLEXITY: Low     GOALS: Goals reviewed with patient? No   SHORT TERM GOALS: Target date: 09/03/2022   Pt will be compliant and knowledgeable with initial HEP for improved comfort and carryover Baseline: initial HEP given  Goal status: INITIAL   2.  Pt will self report back pain no greater than 6/10 for improved comfort and functional ability Baseline: 9/10 at worst Goal status: INITIAL    LONG TERM GOALS: Target date: 10/08/2022   Pt will self report back pain no greater than 2-3/10 for improved comfort and functional ability Baseline: 9/10 at worst Goal status: INITIAL    2.  Pt will decrease ODI disability score to no greater than 25% as proxy for functional improvement Baseline: 44% disability Goal status: INITIAL   3.  Pt will improve bilateral thoracic rotation  to no less than 85 degrees for improved comfort and function Baseline: see chart Goal status: INITIAL   4.  Pt will increase bilateral middle/lower trap strength to no less than 4+/5 for improved comfort and postural endurance Baseline: see chart Goal status: INITIAL   PLAN: PT FREQUENCY:  1x/week   PT DURATION: 6 weeks   PLANNED INTERVENTIONS: Therapeutic exercises, Therapeutic activity, Neuromuscular re-education, Balance training, Gait training, Patient/Family education, Self Care, Joint mobilization, Dry Needling, Electrical stimulation, Cryotherapy, Moist heat, Manual therapy, and Re-evaluation.   PLAN FOR NEXT SESSION: progress thoracic mobility and periscapular/core strength    Lanice Shirts, PT 08/20/2022, 6:25 PM

## 2022-08-20 ENCOUNTER — Ambulatory Visit: Payer: BC Managed Care – PPO

## 2022-08-20 DIAGNOSIS — M5459 Other low back pain: Secondary | ICD-10-CM

## 2022-08-20 DIAGNOSIS — M546 Pain in thoracic spine: Secondary | ICD-10-CM | POA: Diagnosis not present

## 2022-08-20 DIAGNOSIS — R293 Abnormal posture: Secondary | ICD-10-CM

## 2022-08-20 DIAGNOSIS — M6281 Muscle weakness (generalized): Secondary | ICD-10-CM

## 2022-08-26 ENCOUNTER — Ambulatory Visit: Payer: BC Managed Care – PPO

## 2022-08-26 DIAGNOSIS — R293 Abnormal posture: Secondary | ICD-10-CM

## 2022-08-26 DIAGNOSIS — M546 Pain in thoracic spine: Secondary | ICD-10-CM | POA: Diagnosis not present

## 2022-08-26 DIAGNOSIS — M5459 Other low back pain: Secondary | ICD-10-CM

## 2022-08-26 DIAGNOSIS — M6281 Muscle weakness (generalized): Secondary | ICD-10-CM

## 2022-08-26 NOTE — Therapy (Signed)
OUTPATIENT PHYSICAL THERAPY TREATMENT NOTE   Patient Name: Megan Ball MRN: [id number] DOB:06-07-1991, 31 y.o., female Today's Date: 08/26/2022  PCP: Gillian Scarce, MD REFERRING PROVIDER: Janne Napoleon, MD  END OF SESSION:   PT End of Session - 08/26/22 1744     Visit Number 3    Number of Visits 6    Date for PT Re-Evaluation 10/08/22    Authorization Type BCBS    PT Start Time 1745    PT Stop Time 1825    PT Time Calculation (min) 40 min    Activity Tolerance Patient tolerated treatment well    Behavior During Therapy [facility] for tasks assessed/performed              Past Medical History:  Diagnosis Date   Anemia    Eczema    Gestational thrombocytopenia ([facility]) 09/17/2013   Obesity    Postpartum care following cesarean delivery (11/7) 09/16/2013   Seasonal allergies    Shortness of breath    Past Surgical History:  Procedure Laterality Date   CESAREAN SECTION N/A 09/16/2013   Procedure: CESAREAN SECTION;  Surgeon: Genia Del, MD;  Location: [facility];  Service: Obstetrics;  Laterality: N/A;   CESAREAN SECTION N/A 11/27/2020   Procedure: CESAREAN SECTION;  Surgeon: Essie Hart, MD;  Location: [facility];  Service: Obstetrics;  Laterality: N/A;   TONSILLECTOMY     Patient Active Problem List   Diagnosis Date Noted   SOB (shortness of breath) on exertion 03/25/2021   Prediabetes 03/25/2021   Microcytic anemia 11/26/2020   Morbid obesity ([facility]) 11/26/2020   Previous cesarean delivery affecting pregnancy 11/26/2020   Eczema 12/04/2013    REFERRING DIAG: N62 (ICD-10-CM) - Macromastia  THERAPY DIAG: Other low back pain - Plan: PT plan of care cert/re-cert   Muscle weakness (generalized) - Plan: PT plan of care cert/re-cert   Abnormal posture - Plan: PT plan of care cert/re-cert  Rationale for Evaluation and Treatment Rehabilitation  PERTINENT HISTORY: None  PRECAUTIONS: None  SUBJECTIVE: Some mild soreness in L shoulder for 1 day after last session,  resolved after 24 hours  PAIN:  Are you having pain? Yes: NPRS scale: 6/10 Pain location: mid back Pain description: ache Aggravating factors: prolonged positions Relieving factors: rest   OBJECTIVE: (objective measures completed at initial evaluation unless otherwise dated)  DIAGNOSTIC FINDINGS:  N/A   PATIENT SURVEYS:  ODI: 44% disability   COGNITION:           Overall cognitive status: Within functional limits for tasks assessed                 SENSATION: WFL   POSTURE: rounded shoulders, forward head, and larger body habitus    PALPATION: TTP to thoracic paraspinals   THORACIC ROM:    Active  A/PROM  eval  Right rotation 80  Left rotation 75   (Blank rows = not tested)   UPPER EXTREMITY MMT:   MMT Right eval Left eval  Shoulder flexion      Shoulder abduction      Shoulder IR      Shoulder ER      Shoulder extension      Upper trap      Middle trap 3/5 3/5  Lower trap 3/5 3/5  Elbow flexion       Elbow extension      Wrist flexion      Wrist extension      Grip       (  Blank rows = not tested)   TODAY'S TREATMENT  [facility] Adult PT Treatment:                                                DATE: 08/26/22 Therapeutic Exercise: UBE L2 3/3 min Supine OH flexion 15x with inspiration holding 1# weights Supine alternating shoulder flexion 1# 15/15 Supine horizontal abduction RTB 20x Open book 10/10 Seated ER RTB 10x Seated hor abd RTB 10x Bird dog 10/10 Prone flexion 15x Prone extension 15x Prone hor abd 15x Prone scaption 15x Corner stretch 30s x3   [facility] Adult PT Treatment:                                                DATE: 08/20/22 Therapeutic Exercise: UBE L1 3/3 min Supine OH flexion 15x with inspiration Supine horizontal abduction RTB 15x Open book 10/10 Seated ER RTB 10x Seated hor abd RTB 10x Doorway stretch 30s x3 Bird dog 10/10  [facility] Adult PT Treatment:                                                DATE: 08/13/2022 Therapeutic  Exercise: Bilateral ER BTB x 5 Seated horizontal abd x 10 BTB Row BTB x 10 Corner stretch x 30"   PATIENT EDUCATION:  Education details: eval findings, ODI, HEP, POC Person educated: Patient Education method: Explanation, Demonstration, and Handouts Education comprehension: verbalized understanding and returned demonstration     HOME EXERCISE PROGRAM: Access Code: [email or web address] URL: [email or web address] Date: 08/13/2022 Prepared by: Octavio Manns   Exercises - Shoulder External Rotation and Scapular Retraction with Resistance  - 1 x daily - 7 x weekly - 3 sets - 10 reps - Standing Shoulder Horizontal Abduction with Resistance  - 1 x daily - 7 x weekly - 3 sets - 10 reps - Standing Shoulder Row with Anchored Resistance  - 1 x daily - 7 x weekly - 3 sets - 10 reps - Corner Pec Major Stretch  - 1 x daily - 7 x weekly - 2-3 reps - 20-30 sec hold   ASSESSMENT:   CLINICAL IMPRESSION: Last sessions stretches allowed her to move about with less restriction.  Continued thoracic mobility tasks, adding alternating UE movements to introduce rotational moments in spine.  Able to complete all requested tasks w/o any increase in L shoulder symptoms.   OBJECTIVE IMPAIRMENTS decreased endurance, decreased mobility, decreased ROM, decreased strength, postural dysfunction, and pain.    ACTIVITY LIMITATIONS sitting, standing, squatting, transfers, and dressing   PARTICIPATION LIMITATIONS: shopping, community activity, occupation, and yard work   PERSONAL FACTORS Time since onset of injury/illness/exacerbation are also affecting patient's functional outcome.    REHAB POTENTIAL: Excellent   CLINICAL DECISION MAKING: Stable/uncomplicated   EVALUATION COMPLEXITY: Low     GOALS: Goals reviewed with patient? No   SHORT TERM GOALS: Target date: 09/03/2022   Pt will be compliant and knowledgeable with initial HEP for improved comfort and carryover Baseline: initial HEP given  Goal  status: INITIAL   2.  Pt will self report back pain no greater than 6/10  for improved comfort and functional ability Baseline: 9/10 at worst Goal status: INITIAL    LONG TERM GOALS: Target date: 10/08/2022   Pt will self report back pain no greater than 2-3/10 for improved comfort and functional ability Baseline: 9/10 at worst Goal status: INITIAL    2.  Pt will decrease ODI disability score to no greater than 25% as proxy for functional improvement Baseline: 44% disability Goal status: INITIAL   3.  Pt will improve bilateral thoracic rotation to no less than 85 degrees for improved comfort and function Baseline: see chart Goal status: INITIAL   4.  Pt will increase bilateral middle/lower trap strength to no less than 4+/5 for improved comfort and postural endurance Baseline: see chart Goal status: INITIAL   PLAN: PT FREQUENCY: 1x/week   PT DURATION: 6 weeks   PLANNED INTERVENTIONS: Therapeutic exercises, Therapeutic activity, Neuromuscular re-education, Balance training, Gait training, Patient/Family education, Self Care, Joint mobilization, Dry Needling, Electrical stimulation, Cryotherapy, Moist heat, Manual therapy, and Re-evaluation.   PLAN FOR NEXT SESSION: progress thoracic mobility and periscapular/core strength    Hildred Laser, PT 08/26/2022, 6:27 PM

## 2022-09-02 ENCOUNTER — Ambulatory Visit: Payer: BC Managed Care – PPO

## 2022-09-09 ENCOUNTER — Ambulatory Visit: Payer: BC Managed Care – PPO

## 2022-09-09 DIAGNOSIS — M546 Pain in thoracic spine: Secondary | ICD-10-CM | POA: Diagnosis not present

## 2022-09-09 DIAGNOSIS — M5459 Other low back pain: Secondary | ICD-10-CM

## 2022-09-09 DIAGNOSIS — M6281 Muscle weakness (generalized): Secondary | ICD-10-CM

## 2022-09-09 NOTE — Therapy (Signed)
OUTPATIENT PHYSICAL THERAPY TREATMENT NOTE   Patient Name: Megan Ball MRN: [id number] DOB:[birth year], 31 y.o., female Today's Date: 09/09/2022  PCP: Jonathon Resides, MD REFERRING PROVIDER: Lennice Sites, MD  END OF SESSION:   PT End of Session - 09/09/22 1740     Visit Number 4    Number of Visits 6    Date for PT Re-Evaluation 10/08/22    Authorization Type BCBS    PT Start Time 1740    PT Stop Time 1820    PT Time Calculation (min) 40 min    Activity Tolerance Patient tolerated treatment well    Behavior During Therapy [facility] for tasks assessed/performed               Past Medical History:  Diagnosis Date   Anemia    Eczema    Gestational thrombocytopenia ([place]) 09/17/2013   Obesity    Postpartum care following cesarean delivery (11/7) 09/16/2013   Seasonal allergies    Shortness of breath    Past Surgical History:  Procedure Laterality Date   CESAREAN SECTION N/A 09/16/2013   Procedure: CESAREAN SECTION;  Surgeon: Princess Bruins, MD;  Location: [facility];  Service: Obstetrics;  Laterality: N/A;   CESAREAN SECTION N/A 11/27/2020   Procedure: CESAREAN SECTION;  Surgeon: Sanjuana Kava, MD;  Location: [facility];  Service: Obstetrics;  Laterality: N/A;   TONSILLECTOMY     Patient Active Problem List   Diagnosis Date Noted   SOB (shortness of breath) on exertion 03/25/2021   Prediabetes 03/25/2021   Microcytic anemia 11/26/2020   Morbid obesity ([place]) 11/26/2020   Previous cesarean delivery affecting pregnancy 11/26/2020   Eczema 12/04/2013    REFERRING DIAG: N62 (ICD-10-CM) - Macromastia  THERAPY DIAG: Other low back pain - Plan: PT plan of care cert/re-cert   Muscle weakness (generalized) - Plan: PT plan of care cert/re-cert   Abnormal posture - Plan: PT plan of care cert/re-cert  Rationale for Evaluation and Treatment Rehabilitation  PERTINENT HISTORY: None  PRECAUTIONS: None  SUBJECTIVE: L shoulder pain has resolved but upper back symptoms  persist.  Unable to identify aggravating or relieving factors  PAIN:  Are you having pain? Yes: NPRS scale: 6/10 Pain location: mid back Pain description: ache Aggravating factors: prolonged positions Relieving factors: rest   OBJECTIVE: (objective measures completed at initial evaluation unless otherwise dated)  DIAGNOSTIC FINDINGS:  N/A   PATIENT SURVEYS:  ODI: 44% disability   COGNITION:           Overall cognitive status: Within functional limits for tasks assessed                 SENSATION: WFL   POSTURE: rounded shoulders, forward head, and larger body habitus    PALPATION: TTP to thoracic paraspinals   THORACIC ROM:    Active  A/PROM  eval  Right rotation 80  Left rotation 75   (Blank rows = not tested)   UPPER EXTREMITY MMT:   MMT Right eval Left eval  Shoulder flexion      Shoulder abduction      Shoulder IR      Shoulder ER      Shoulder extension      Upper trap      Middle trap 3/5 3/5  Lower trap 3/5 3/5  Elbow flexion       Elbow extension      Wrist flexion      Wrist extension      Grip       (  Blank rows = not tested)   TODAY'S TREATMENT  [facility] Adult PT Treatment:                                                DATE: 09/09/22 Therapeutic Exercise: Nustep L4 8 min Supine OH flexion 15x with inspiration holding 1# weights Supine alternating shoulder flexion 1# 15/15 Supine horizontal abduction GTB 15x Open book 10/10 1# Seated ER RTB 15x Seated hor abd RTB 15x Prone flexion 15x 1# Prone extension 15x 1# Prone hor abd 15x 1# Prone scaption 15x 1#   [facility] Adult PT Treatment:                                                DATE: 08/26/22 Therapeutic Exercise: UBE L2 3/3 min Supine OH flexion 15x with inspiration holding 1# weights Supine alternating shoulder flexion 1# 15/15 Supine horizontal abduction RTB 20x Open book 10/10 Seated ER RTB 10x Seated hor abd RTB 10x Bird dog 10/10 Prone flexion 15x Prone extension 15x Prone  hor abd 15x Prone scaption 15x Corner stretch 30s x3   [facility] Adult PT Treatment:                                                DATE: 08/20/22 Therapeutic Exercise: UBE L1 3/3 min Supine OH flexion 15x with inspiration Supine horizontal abduction RTB 15x Open book 10/10 Seated ER RTB 10x Seated hor abd RTB 10x Doorway stretch 30s x3 Bird dog 10/10      HOME EXERCISE PROGRAM: Access Code: [email or web address] URL: [email or web address] Date: 08/13/2022 Prepared by: Octavio Manns   Exercises - Shoulder External Rotation and Scapular Retraction with Resistance  - 1 x daily - 7 x weekly - 3 sets - 10 reps - Standing Shoulder Horizontal Abduction with Resistance  - 1 x daily - 7 x weekly - 3 sets - 10 reps - Standing Shoulder Row with Anchored Resistance  - 1 x daily - 7 x weekly - 3 sets - 10 reps - Corner Pec Major Stretch  - 1 x daily - 7 x weekly - 2-3 reps - 20-30 sec hold   ASSESSMENT:   CLINICAL IMPRESSION: Shoulder symptoms have resolved but upper back symptoms persist.  Discussed use of lumbar support with sitting in office chair to promote improved thoracic posture.  Added resistance and reps as noted to challenge patient and promote thoracic mobility/stability.    OBJECTIVE IMPAIRMENTS decreased endurance, decreased mobility, decreased ROM, decreased strength, postural dysfunction, and pain.    ACTIVITY LIMITATIONS sitting, standing, squatting, transfers, and dressing   PARTICIPATION LIMITATIONS: shopping, community activity, occupation, and yard work   PERSONAL FACTORS Time since onset of injury/illness/exacerbation are also affecting patient's functional outcome.    REHAB POTENTIAL: Excellent   CLINICAL DECISION MAKING: Stable/uncomplicated   EVALUATION COMPLEXITY: Low     GOALS: Goals reviewed with patient? No   SHORT TERM GOALS: Target date: 09/03/2022   Pt will be compliant and knowledgeable with initial HEP for improved comfort and  carryover Baseline: initial HEP given  Goal status: INITIAL   2.  Pt  will self report back pain no greater than 6/10 for improved comfort and functional ability Baseline: 9/10 at worst Goal status: INITIAL    LONG TERM GOALS: Target date: 10/08/2022   Pt will self report back pain no greater than 2-3/10 for improved comfort and functional ability Baseline: 9/10 at worst Goal status: INITIAL    2.  Pt will decrease ODI disability score to no greater than 25% as proxy for functional improvement Baseline: 44% disability Goal status: INITIAL   3.  Pt will improve bilateral thoracic rotation to no less than 85 degrees for improved comfort and function Baseline: see chart Goal status: INITIAL   4.  Pt will increase bilateral middle/lower trap strength to no less than 4+/5 for improved comfort and postural endurance Baseline: see chart Goal status: INITIAL   PLAN: PT FREQUENCY: 1x/week   PT DURATION: 6 weeks   PLANNED INTERVENTIONS: Therapeutic exercises, Therapeutic activity, Neuromuscular re-education, Balance training, Gait training, Patient/Family education, Self Care, Joint mobilization, Dry Needling, Electrical stimulation, Cryotherapy, Moist heat, Manual therapy, and Re-evaluation.   PLAN FOR NEXT SESSION: progress thoracic mobility and periscapular/core strength    Lanice Shirts, PT 09/09/2022, 6:21 PM

## 2022-09-16 ENCOUNTER — Ambulatory Visit: Payer: BC Managed Care – PPO | Attending: Plastic Surgery

## 2022-09-16 DIAGNOSIS — M546 Pain in thoracic spine: Secondary | ICD-10-CM | POA: Diagnosis not present

## 2022-09-16 DIAGNOSIS — M6281 Muscle weakness (generalized): Secondary | ICD-10-CM | POA: Diagnosis present

## 2022-09-16 DIAGNOSIS — R293 Abnormal posture: Secondary | ICD-10-CM

## 2022-09-16 DIAGNOSIS — M5459 Other low back pain: Secondary | ICD-10-CM

## 2022-09-16 NOTE — Therapy (Signed)
OUTPATIENT PHYSICAL THERAPY TREATMENT NOTE   Patient Name: Megan Ball MRN: [id number] DOB:Oct 11, 1991, 31 y.o., female Today's Date: 09/16/2022  PCP: Jonathon Resides, MD REFERRING PROVIDER: Lennice Sites, MD  END OF SESSION:   PT End of Session - 09/16/22 1833     Visit Number 5    Number of Visits 6    Date for PT Re-Evaluation 10/08/22    Authorization Type BCBS    PT Start Time 1832    PT Stop Time 1910    PT Time Calculation (min) 38 min    Activity Tolerance Patient tolerated treatment well    Behavior During Therapy [facility] for tasks assessed/performed                Past Medical History:  Diagnosis Date   Anemia    Eczema    Gestational thrombocytopenia ([place]) 09/17/2013   Obesity    Postpartum care following cesarean delivery (11/7) 09/16/2013   Seasonal allergies    Shortness of breath    Past Surgical History:  Procedure Laterality Date   CESAREAN SECTION N/A 09/16/2013   Procedure: CESAREAN SECTION;  Surgeon: Princess Bruins, MD;  Location: [place] [facility];  Service: Obstetrics;  Laterality: N/A;   CESAREAN SECTION N/A 11/27/2020   Procedure: CESAREAN SECTION;  Surgeon: Sanjuana Kava, MD;  Location: [facility];  Service: Obstetrics;  Laterality: N/A;   TONSILLECTOMY     Patient Active Problem List   Diagnosis Date Noted   SOB (shortness of breath) on exertion 03/25/2021   Prediabetes 03/25/2021   Microcytic anemia 11/26/2020   Morbid obesity ([place]) 11/26/2020   Previous cesarean delivery affecting pregnancy 11/26/2020   Eczema 12/04/2013    REFERRING DIAG: N62 (ICD-10-CM) - Macromastia  THERAPY DIAG: Other low back pain - Plan: PT plan of care cert/re-cert   Muscle weakness (generalized) - Plan: PT plan of care cert/re-cert   Abnormal posture - Plan: PT plan of care cert/re-cert  Rationale for Evaluation and Treatment Rehabilitation  PERTINENT HISTORY: None  PRECAUTIONS: None  SUBJECTIVE: Patient states her pain is at a 10/10 today because of  doing a lot of things today.  PAIN:  Are you having pain? Yes: NPRS scale: 10/10 Pain location: mid back Pain description: ache Aggravating factors: prolonged positions Relieving factors: rest   OBJECTIVE: (objective measures completed at initial evaluation unless otherwise dated)  DIAGNOSTIC FINDINGS:  N/A   PATIENT SURVEYS:  ODI: 44% disability   COGNITION:           Overall cognitive status: Within functional limits for tasks assessed                 SENSATION: WFL   POSTURE: rounded shoulders, forward head, and larger body habitus    PALPATION: TTP to thoracic paraspinals   THORACIC ROM:    Active  A/PROM  eval  Right rotation 80  Left rotation 75   (Blank rows = not tested)   UPPER EXTREMITY MMT:   MMT Right eval Left eval  Shoulder flexion      Shoulder abduction      Shoulder IR      Shoulder ER      Shoulder extension      Upper trap      Middle trap 3/5 3/5  Lower trap 3/5 3/5  Elbow flexion       Elbow extension      Wrist flexion      Wrist extension      Grip       (  Blank rows = not tested)   TODAY'S TREATMENT  [facility] Adult PT Treatment:                                                DATE: 09/16/2022 Therapeutic Exercise: UBE level 2 x 3/3 mins fwd/bwd Lat pull down 25# 2x10 High/low rows 25# 2x10 Standing horizontal abduction back against wall RTB 2x10 Standing diagonals back against wall RTB x10 BIL Doorway pec stretch 2x45" Palloff press 7# 2x10 Self Care: Desk ergonomics handout   [facility] Adult PT Treatment:                                                DATE: 09/09/22 Therapeutic Exercise: Nustep L4 8 min Supine OH flexion 15x with inspiration holding 1# weights Supine alternating shoulder flexion 1# 15/15 Supine horizontal abduction GTB 15x Open book 10/10 1# Seated ER RTB 15x Seated hor abd RTB 15x Prone flexion 15x 1# Prone extension 15x 1# Prone hor abd 15x 1# Prone scaption 15x 1#   [facility] Adult PT Treatment:                                                 DATE: 08/26/22 Therapeutic Exercise: UBE L2 3/3 min Supine OH flexion 15x with inspiration holding 1# weights Supine alternating shoulder flexion 1# 15/15 Supine horizontal abduction RTB 20x Open book 10/10 Seated ER RTB 10x Seated hor abd RTB 10x Bird dog 10/10 Prone flexion 15x Prone extension 15x Prone hor abd 15x Prone scaption 15x Corner stretch 30s x3     HOME EXERCISE PROGRAM: Access Code: [email or web address] URL: [email or web address] Date: 08/13/2022 Prepared by: Edwinna Areola   Exercises - Shoulder External Rotation and Scapular Retraction with Resistance  - 1 x daily - 7 x weekly - 3 sets - 10 reps - Standing Shoulder Horizontal Abduction with Resistance  - 1 x daily - 7 x weekly - 3 sets - 10 reps - Standing Shoulder Row with Anchored Resistance  - 1 x daily - 7 x weekly - 3 sets - 10 reps - Corner Pec Major Stretch  - 1 x daily - 7 x weekly - 2-3 reps - 20-30 sec hold   ASSESSMENT:   CLINICAL IMPRESSION: Patient presents to PT with high levels of pain in her neck and shoulders and reports HEP compliance. Session today focused on periscapular strengthening as well as education on desk ergonomics. Patient was able to tolerate all prescribed exercises with no adverse effects, despite reports of high pain, she was not limited during exercises performed. She reports 6/10 pain at end of session. Patient continues to benefit from skilled PT services and should be progressed as able to improve functional independence.     OBJECTIVE IMPAIRMENTS decreased endurance, decreased mobility, decreased ROM, decreased strength, postural dysfunction, and pain.    ACTIVITY LIMITATIONS sitting, standing, squatting, transfers, and dressing   PARTICIPATION LIMITATIONS: shopping, community activity, occupation, and yard work   PERSONAL FACTORS Time since onset of injury/illness/exacerbation are also affecting patient's functional outcome.     REHAB POTENTIAL: Excellent   CLINICAL DECISION MAKING:  Stable/uncomplicated   EVALUATION COMPLEXITY: Low     GOALS: Goals reviewed with patient? No   SHORT TERM GOALS: Target date: 09/03/2022   Pt will be compliant and knowledgeable with initial HEP for improved comfort and carryover Baseline: initial HEP given  Goal status: INITIAL   2.  Pt will self report back pain no greater than 6/10 for improved comfort and functional ability Baseline: 9/10 at worst Goal status: INITIAL    LONG TERM GOALS: Target date: 10/08/2022   Pt will self report back pain no greater than 2-3/10 for improved comfort and functional ability Baseline: 9/10 at worst Goal status: INITIAL    2.  Pt will decrease ODI disability score to no greater than 25% as proxy for functional improvement Baseline: 44% disability Goal status: INITIAL   3.  Pt will improve bilateral thoracic rotation to no less than 85 degrees for improved comfort and function Baseline: see chart Goal status: INITIAL   4.  Pt will increase bilateral middle/lower trap strength to no less than 4+/5 for improved comfort and postural endurance Baseline: see chart Goal status: INITIAL   PLAN: PT FREQUENCY: 1x/week   PT DURATION: 6 weeks   PLANNED INTERVENTIONS: Therapeutic exercises, Therapeutic activity, Neuromuscular re-education, Balance training, Gait training, Patient/Family education, Self Care, Joint mobilization, Dry Needling, Electrical stimulation, Cryotherapy, Moist heat, Manual therapy, and Re-evaluation.   PLAN FOR NEXT SESSION: progress thoracic mobility and periscapular/core strength    Berta Minor, PTA 09/16/2022, 7:09 PM

## 2022-09-22 NOTE — Progress Notes (Deleted)
   Referring Provider No referring provider defined for this encounter.   CC: No chief complaint on file.     Yovana Scogin is an 31 y.o. female.  HPI: Patient is a 31 y.o. year old female here for follow up after completing physical therapy for pain related to macromastia.  She was seen for initial consult by Dr. Ladona Ridgel.  At that time, she complained of chronic upper back and neck discomfort in the context of large, pendulous breasts.  She was noted to have a BMI of approximately 50 kg/m and she partially attributed her inability to exercise to her large breasts as it hinders activities.  Grade 3 ptosis was noted on exam.  Discussed the possibility of breast reduction surgery, as well as the requisite for drains and possibly a free nipple graft.  She understands and was agreeable.    Per her chart, she has since completed 5 PT sessions.  She will need a 6 visit, per her [employer].  Also, all BCBS insurance patients who have a BMI greater than 25 kg/m need to be referred to a weight management team for weight reduction before authorization is granted. ***    Review of Systems General: Denies fevers MSK: Endorses ongoing back and neck discomfort Skin: Denies rashes ***  Physical Exam    08/14/2022    9:00 AM 07/28/2022    3:16 PM 01/27/2022    3:00 PM  Vitals with BMI  Height 5\' 1"  5\' 1"  5\' 1"   Weight 265 lbs 13 oz 265 lbs 264 lbs  BMI 50.25 50.1 49.91  Systolic 116 132  Diastolic 79 84 73  Pulse 96 75 81    General:  No acute distress,  Alert and oriented, Non-Toxic, Normal speech and affect Psych: Normal behavior and mood Respiratory: No increased WOB MSK: Ambulatory  Assessment/Plan ***  Patient is interested in pursuing surgical intervention for bilateral breast reduction. Patient has completed at least 6 weeks of physical therapy for pain related to macromastia.  Discussed with patient we would submit to insurance for authorization, discussed approval could  take up to 6 weeks.   09/22/2022, 3:50 PM

## 2022-09-23 ENCOUNTER — Ambulatory Visit: Payer: BC Managed Care – PPO

## 2022-09-23 DIAGNOSIS — R293 Abnormal posture: Secondary | ICD-10-CM

## 2022-09-23 DIAGNOSIS — M5459 Other low back pain: Secondary | ICD-10-CM

## 2022-09-23 DIAGNOSIS — M546 Pain in thoracic spine: Secondary | ICD-10-CM | POA: Diagnosis not present

## 2022-09-23 DIAGNOSIS — M6281 Muscle weakness (generalized): Secondary | ICD-10-CM

## 2022-09-23 NOTE — Therapy (Signed)
OUTPATIENT PHYSICAL THERAPY TREATMENT NOTE/DC SUMMARY   Patient Name: Megan Ball MRN: [id number] DOB:11/24/1990, 31 y.o., female Today's Date: 09/23/2022  PCP: Jonathon Resides, MD REFERRING PROVIDER: Lennice Sites, MD PHYSICAL THERAPY DISCHARGE SUMMARY  Visits from Start of Care: 6  Current functional level related to goals / functional outcomes: Goals partially met, maximum function regained   Remaining deficits: discomfort   Education / Equipment: HEP   Patient agrees to discharge. Patient goals were partially met. Patient is being discharged due to being pleased with the current functional level.  END OF SESSION:   PT End of Session - 09/23/22 1831     Visit Number 6    Number of Visits 6    Date for PT Re-Evaluation 10/08/22    Authorization Type BCBS    PT Start Time 1830    PT Stop Time 1910    PT Time Calculation (min) 40 min    Activity Tolerance Patient tolerated treatment well    Behavior During Therapy [facility] for tasks assessed/performed                 Past Medical History:  Diagnosis Date   Anemia    Eczema    Gestational thrombocytopenia ([place]) 09/17/2013   Obesity    Postpartum care following cesarean delivery (11/7) 09/16/2013   Seasonal allergies    Shortness of breath    Past Surgical History:  Procedure Laterality Date   CESAREAN SECTION N/A 09/16/2013   Procedure: CESAREAN SECTION;  Surgeon: Princess Bruins, MD;  Location: [place] [facility];  Service: Obstetrics;  Laterality: N/A;   CESAREAN SECTION N/A 11/27/2020   Procedure: CESAREAN SECTION;  Surgeon: Sanjuana Kava, MD;  Location: [facility];  Service: Obstetrics;  Laterality: N/A;   TONSILLECTOMY     Patient Active Problem List   Diagnosis Date Noted   SOB (shortness of breath) on exertion 03/25/2021   Prediabetes 03/25/2021   Microcytic anemia 11/26/2020   Morbid obesity ([place]) 11/26/2020   Previous cesarean delivery affecting pregnancy 11/26/2020   Eczema 12/04/2013    REFERRING  DIAG: N62 (ICD-10-CM) - Macromastia  THERAPY DIAG: Other low back pain - Plan: PT plan of care cert/re-cert   Muscle weakness (generalized) - Plan: PT plan of care cert/re-cert   Abnormal posture - Plan: PT plan of care cert/re-cert  Rationale for Evaluation and Treatment Rehabilitation  PERTINENT HISTORY: None  PRECAUTIONS: None  SUBJECTIVE: pain rating 5/10 today, can spike to 8/10 with an increase in activity level.    PAIN:  Are you having pain? Yes: NPRS scale: 5/10 Pain location: mid back Pain description: ache Aggravating factors: prolonged positions Relieving factors: rest   OBJECTIVE: (objective measures completed at initial evaluation unless otherwise dated)  DIAGNOSTIC FINDINGS:  N/A   PATIENT SURVEYS:  ODI: 44% disability; 09/23/22 23%   COGNITION:           Overall cognitive status: Within functional limits for tasks assessed                 SENSATION: WFL   POSTURE: rounded shoulders, forward head, and larger body habitus    PALPATION: TTP to thoracic paraspinals   THORACIC ROM:    Active  A/PROM  eval A/PROM 09/23/22  Right rotation 80 85  Left rotation 75 80   (Blank rows = not tested)   UPPER EXTREMITY MMT:   MMT Right eval Left eval R 09/23/22 L 09/23/22  Shoulder flexion        Shoulder abduction  Shoulder IR        Shoulder ER        Shoulder extension        Upper trap        Middle trap 3/5 3/5 4+ 4+  Lower trap 3/5 3/5 4+ 4+  Elbow flexion         Elbow extension        Wrist flexion        Wrist extension        Grip         (Blank rows = not tested)   TODAY'S TREATMENT  [facility] Adult PT Treatment:                                                DATE: 09/23/22 Therapeutic Exercise: UBE level 1 x 3/3 mins fwd/bwd Lat pull down 25# 2x15 High/low rows 25# 2x15 Standing horizontal abduction back against wall RTB 2x10 Standing diagonals back against wall RTB x10 BIL Doorway pec stretch 3x30s Supine flexion w/cane  2# 10x w/inspiration   [facility] Adult PT Treatment:                                                DATE: 09/16/2022 Therapeutic Exercise: UBE level 2 x 3/3 mins fwd/bwd Lat pull down 25# 2x10 High/low rows 25# 2x10 Standing horizontal abduction back against wall RTB 2x10 Standing diagonals back against wall RTB x10 BIL Doorway pec stretch 2x45" Palloff press 7# 2x10 Self Care: Desk ergonomics handout   [facility] Adult PT Treatment:                                                DATE: 09/09/22 Therapeutic Exercise: Nustep L4 8 min Supine OH flexion 15x with inspiration holding 1# weights Supine alternating shoulder flexion 1# 15/15 Supine horizontal abduction GTB 15x Open book 10/10 1# Seated ER RTB 15x Seated hor abd RTB 15x Prone flexion 15x 1# Prone extension 15x 1# Prone hor abd 15x 1# Prone scaption 15x 1#   [place] Adult PT Treatment:                                                DATE: 08/26/22 Therapeutic Exercise: UBE L2 3/3 min Supine OH flexion 15x with inspiration holding 1# weights Supine alternating shoulder flexion 1# 15/15 Supine horizontal abduction RTB 20x Open book 10/10 Seated ER RTB 10x Seated hor abd RTB 10x Bird dog 10/10 Prone flexion 15x Prone extension 15x Prone hor abd 15x Prone scaption 15x Corner stretch 30s x3     HOME EXERCISE PROGRAM: Access Code: [email or web address] URL: [email or web address] Date: 08/13/2022 Prepared by: Octavio Manns   Exercises - Shoulder External Rotation and Scapular Retraction with Resistance  - 1 x daily - 7 x weekly - 3 sets - 10 reps - Standing Shoulder Horizontal Abduction with Resistance  - 1 x daily - 7 x weekly - 3 sets - 10 reps - Standing Shoulder  Row with Anchored Resistance  - 1 x daily - 7 x weekly - 3 sets - 10 reps - Corner Pec Major Stretch  - 1 x daily - 7 x weekly - 2-3 reps - 20-30 sec hold   ASSESSMENT:   CLINICAL IMPRESSION: Rehab goals met or maximum function regained.  Patient to f/u with MD       OBJECTIVE IMPAIRMENTS decreased endurance, decreased mobility, decreased ROM, decreased strength, postural dysfunction, and pain.    ACTIVITY LIMITATIONS sitting, standing, squatting, transfers, and dressing   PARTICIPATION LIMITATIONS: shopping, community activity, occupation, and yard work   PERSONAL FACTORS Time since onset of injury/illness/exacerbation are also affecting patient's functional outcome.    REHAB POTENTIAL: Excellent   CLINICAL DECISION MAKING: Stable/uncomplicated   EVALUATION COMPLEXITY: Low     GOALS: Goals reviewed with patient? No   SHORT TERM GOALS: Target date: 09/03/2022   Pt will be compliant and knowledgeable with initial HEP for improved comfort and carryover Baseline: initial HEP given  Goal status: Met   2.  Pt will self report back pain no greater than 6/10 for improved comfort and functional ability Baseline: 9/10 at worst; 09/23/22 8/10 at worst 5-6/10 baseline Goal status: Met   LONG TERM GOALS: Target date: 10/08/2022   Pt will self report back pain no greater than 2-3/10 for improved comfort and functional ability Baseline: 9/10 at worst; 09/23/22 8/10 at worst 5-6/10 baseline Goal status: Not met   2.  Pt will decrease ODI disability score to no greater than 25% as proxy for functional improvement Baseline: 44% disability; 09/23/22 32%  Goal status: Not met   3.  Pt will improve bilateral thoracic rotation to no less than 85 degrees for improved comfort and function Baseline: see chart Goal status: Met   4.  Pt will increase bilateral middle/lower trap strength to no less than 4+/5 for improved comfort and postural endurance Baseline: see chart Goal status: Met   PLAN: PT FREQUENCY: 1x/week   PT DURATION: 6 weeks   PLANNED INTERVENTIONS: Therapeutic exercises, Therapeutic activity, Neuromuscular re-education, Balance training, Gait training, Patient/Family education, Self Care, Joint mobilization, Dry Needling,  Electrical stimulation, Cryotherapy, Moist heat, Manual therapy, and Re-evaluation.   PLAN FOR NEXT SESSION: DC to HEP and MD care    Lanice Shirts, PT 09/23/2022, 7:09 PM

## 2022-09-24 ENCOUNTER — Encounter: Payer: Self-pay | Admitting: Student

## 2022-09-24 ENCOUNTER — Ambulatory Visit (INDEPENDENT_AMBULATORY_CARE_PROVIDER_SITE_OTHER): Payer: BC Managed Care – PPO | Admitting: Student

## 2022-09-24 VITALS — BP 102/67 | HR 96 | Ht 61.0 in | Wt 255.0 lb

## 2022-09-24 DIAGNOSIS — Z6841 Body Mass Index (BMI) 40.0 and over, adult: Secondary | ICD-10-CM | POA: Diagnosis not present

## 2022-09-24 DIAGNOSIS — R21 Rash and other nonspecific skin eruption: Secondary | ICD-10-CM

## 2022-09-24 DIAGNOSIS — N62 Hypertrophy of breast: Secondary | ICD-10-CM

## 2022-09-24 DIAGNOSIS — M546 Pain in thoracic spine: Secondary | ICD-10-CM

## 2022-09-24 NOTE — Progress Notes (Signed)
Referring Provider No referring provider defined for this encounter.   CC: No chief complaint on file.     Megan Ball is an 31 y.o. female.  HPI: Patient is a 31 y.o. year old female here for follow up after completing physical therapy for pain related to macromastia.  She was seen for initial consult by Dr. Ladona Ridgel.  At that time, she complained of chronic upper back and neck discomfort in the context of large, pendulous breasts.  She was noted to have a BMI of approximately 50 kg/m and she partially attributed her inability to exercise to her large breasts as it hinders activities.  Grade 3 ptosis was noted on exam.  Discussed the possibility of breast reduction surgery, as well as the requisite for drains and possibly a free nipple graft.  She understands and was agreeable.    Today, patient reports she is doing well.  She states that she has completed 6 sessions of physical therapy.  She reports although she noticed a slight improvement in her symptoms, she overall is still having back pain caused by her enlarged breasts.  She reports that she also sometimes gets rashes underneath her breast.  She denies any recent changes in her health.  Patient still expresses the interest in pursuing surgical intervention for bilateral breast reduction.  Patient's weight today is 255 pounds.  She has lost 10 pounds since her previous visit.  Her BMI is 48 kg/m2.  Patient reports that she has been increasing her exercises, including walking.  She reports that she has been drinking more water and monitoring what she eats.  Patient reports she has an appointment with healthy weight and wellness next week.  Review of Systems General: Denies fevers, denies recent changes in health MSK: Endorses ongoing back and neck discomfort Skin: Reports intermittent rashes.  Physical Exam    08/14/2022    9:00 AM 07/28/2022    3:16 PM 01/27/2022    3:00 PM  Vitals with BMI  Height 5\' 1"  5\' 1"  5\' 1"   Weight 265  lbs 13 oz 265 lbs 264 lbs  BMI 50.25 50.1 49.91  Systolic 116 132  Diastolic 79 84 73  Pulse 96 75 81    General:  No acute distress,  Alert and oriented, Non-Toxic, Normal speech and affect Psych: Normal behavior and mood Respiratory: No increased WOB MSK: Ambulatory  Assessment/Plan  Macromastia  Breast hypertrophy   Patient is interested in pursuing surgical intervention for bilateral breast reduction. Patient has completed at least 6 weeks of physical therapy for pain related to macromastia.  I encouraged the patient to continue to lose weight.  I encouraged her to decrease her carbohydrates and increase her proteins.  I also encouraged her to continue following up with healthy weight and wellness.  Patient expressed understanding and is motivated to lose weight.  I discussed with the patient that although she has lost a little bit of weight, her BMI is still a little too high to proceed with surgery at this time.  I discussed with her that we would want to see her BMI a little lower, a little closer to 40 kg/m2.  I discussed with the patient that having a higher BMI puts her at higher risk of complications after surgery.  Patient expressed understanding.  We will plan to have the patient follow-up in 4 months to reevaluate her.  Patient was in agreement with this.  I discussed with the patient that once her BMI has come  down and we decide to proceed with surgery, we will submit to insurance for authorization at that time.  I discussed with patient we would submit to insurance for authorization, discussed approval could take up to 6 weeks.   I instructed the patient to call in the meantime if she has any questions or concerns.  Laurena Spies 09/24/2022, 3:26 PM

## 2023-01-14 NOTE — Progress Notes (Signed)
Referring Provider No referring provider defined for this encounter.   CC: Follow Up      Megan Ball is an 32 y.o. female.  HPI: Patient is a 32 year old female with history of macromastia.  She presents to the clinic today for follow-up and to check on her weight in the context of optimization for surgery.  She was seen for initial consult by Dr. Lovena Le on 08/14/2022.  At this visit, she complained of chronic upper back pain and neck discomfort in the context of her large pendulous breast.  She was noted to have a BMI of approximately 50 kg/m and partially attributed her inability to exercise due to her enlarged breasts hindering her activities.  On exam, patient was noted to have grade 3 ptosis.  It was discussed with the patient for the possibility of breast reduction surgery as well as possible free nipple graft.  Physical therapy was ordered for the patient.  Patient was later then seen on 09/24/2022.  At this visit, she reported that she completed 6 sessions of physical therapy.  She stated that she had some slight improvement in her symptoms, but overall was having back pain caused by her enlarged breast.  She also reported she was getting rashes underneath her breast.  At this visit, her weight was 255 pounds.  Her BMI was 48 kg/m.  She reported that she was working on weight loss and had an appointment with healthy weight and wellness the following week.  The plan was for patient to continue to work on her weight reduction to optimize her for surgery.  Today, patient reports she is doing well.  Her weight today is 239.4 pounds and her height is 5 foot 1 inch.  Her BMI today is 45 kg/m.    Review of Systems General: No changes in health  Physical Exam    01/16/2023    8:03 AM 09/24/2022    3:33 PM 08/14/2022    9:00 AM  Vitals with BMI  Height 5\' 1"  5\' 1"  5\' 1"   Weight 239 lbs 6 oz 255 lbs 265 lbs 13 oz  BMI 45.26 37.85 88.50  Systolic 277 412 878  Diastolic 69 67 79   Pulse 103 96 96    General:  No acute distress,  Alert and oriented, Non-Toxic, Normal speech and affect Respiratory: Unlabored breathing, no respiratory distress MSK: Ambulatory Psych: Normal behavior and mood  Assessment/Plan  Macromastia  Breast hypertrophy   I discussed with the patient that although she has lost some weight, she will need to lose a little bit more before proceeding with surgery.  Patient expressed understanding.  I did discuss the case with Dr. Lovena Le, and once patient's BMI is 42 or below, we will go ahead and proceed with submitting to insurance.  I discussed with the patient that her BMI needs to be 42 or below.  I discussed with her to continue her weight loss and continue to follow-up with healthy weight and wellness.  We will plan to see the patient back in another 6 months to reevaluate her after her weight loss.  I discussed with the patient that if she loses the weight sooner, she can give [name] a call back and we can see her back sooner.  Patient expressed understanding and was in agreement with this plan.  I instructed the patient in the meantime to call if she has any questions or concerns.   Clance Boll 01/16/2023, 11:00 AM

## 2023-01-16 ENCOUNTER — Ambulatory Visit (INDEPENDENT_AMBULATORY_CARE_PROVIDER_SITE_OTHER): Payer: BC Managed Care – PPO | Admitting: Student

## 2023-01-16 VITALS — BP 114/69 | HR 103 | Ht 61.0 in | Wt 239.4 lb

## 2023-01-16 DIAGNOSIS — N62 Hypertrophy of breast: Secondary | ICD-10-CM

## 2023-01-16 DIAGNOSIS — Z6841 Body Mass Index (BMI) 40.0 and over, adult: Secondary | ICD-10-CM

## 2023-07-17 ENCOUNTER — Ambulatory Visit (INDEPENDENT_AMBULATORY_CARE_PROVIDER_SITE_OTHER): Payer: BC Managed Care – PPO | Admitting: Student

## 2023-07-17 ENCOUNTER — Encounter: Payer: Self-pay | Admitting: Student

## 2023-07-17 VITALS — BP 122/84 | HR 91 | Ht 61.0 in | Wt 222.2 lb

## 2023-07-17 DIAGNOSIS — Z6841 Body Mass Index (BMI) 40.0 and over, adult: Secondary | ICD-10-CM

## 2023-07-17 DIAGNOSIS — N62 Hypertrophy of breast: Secondary | ICD-10-CM

## 2023-07-17 NOTE — Progress Notes (Signed)
   Referring Provider No referring provider defined for this encounter.   CC:  Chief Complaint  Patient presents with   Follow-up      Megan Ball is an 32 y.o. female.  HPI: Patient is a 32 year old female with history of macromastia.  She presents to the clinic today for follow-up and discussion of surgical optimization.  She was seen for initial consult by Dr. Ladona Ridgel on 08/14/2022. At this visit, she complained of chronic upper back pain and neck discomfort in the context of her large pendulous breast. She was noted to have a BMI of approximately 50 kg/m and partially attributed her inability to exercise due to her enlarged breasts hindering her activities. On exam, patient was noted to have grade 3 ptosis. It was discussed with the patient for the possibility of breast reduction surgery as well as possible free nipple graft. Physical therapy was ordered for the patient.   Patient was then seen again on 01/16/2023 for a weight check.  Her weight was 239.4 pounds and her height was 5 foot 1 inch.  Her BMI at this visit was 45 kg/m.  It was discussed with the patient that although she lost a little bit of weight, she will need to lose a little bit more prior to moving forward to surgery.  Discussed case with Dr. Ladona Ridgel, and BMI needs to be below 42 in order to proceed with submitting to insurance.  Plan is for patient to continue her weight loss and follow-up with healthy weight and wellness and return to [name] in 6 months.  Today, patient reports she is doing well.  She has lost 17 pounds since her previous visit.  Her BMI today is 41 kg/m.  She denies any changes in her health.  She reports that she would like to move forward with bilateral breast reduction.  Review of Systems General: Denies any changes in her health.  Physical Exam    07/17/2023    8:55 AM 01/16/2023    8:03 AM 09/24/2022    3:33 PM  Vitals with BMI  Height 5\' 1"  5\' 1"  5\' 1"   Weight 222 lbs 3 oz 239 lbs 6 oz 255 lbs   BMI 42.01 45.26 48.21  Systolic 122 114 161  Diastolic 84 69 67  Pulse 91 103 96    General:  No acute distress,  Alert and oriented, Non-Toxic, Normal speech and affect Psych: Normal behavior, normal mood Respiratory: Unlabored breathing, no respiratory distress MSK: Ambulatory  Assessment/Plan  Macromastia   Discussed with patient that given she has achieved BMI goal, we will plan to move forward with surgery.  I did discuss with her though that since it has almost been a year since she has seen Dr. Ladona Ridgel, I am going to have her follow-up with him prior to surgery.  Patient expressed understanding.  I discussed with patient to continue her weight loss.  I discussed with her that once we do submit to insurance, the process can take up to 6 weeks for approval.  Patient expressed understanding.  Pictures were obtained of the patient and placed in the chart with the patient's or guardian's permission.   All of her questions today were answered to her satisfaction.  Laurena Spies 07/17/2023, 9:14 AM

## 2023-07-29 ENCOUNTER — Ambulatory Visit (INDEPENDENT_AMBULATORY_CARE_PROVIDER_SITE_OTHER): Payer: BC Managed Care – PPO | Admitting: Plastic Surgery

## 2023-07-29 VITALS — BP 117/46 | HR 92 | Ht 61.0 in | Wt 224.0 lb

## 2023-07-29 DIAGNOSIS — N62 Hypertrophy of breast: Secondary | ICD-10-CM

## 2023-07-29 NOTE — Progress Notes (Signed)
Megan Ball returns today for weight check.  She has done an outstanding job with her weight loss though she did have a small setback since her last appointment.  Her BMI today is 42.3.  I believe that she is still in the weight loss phase and can make the BMI of 40 that we have discussed.  She has seen healthy weight and wellness and has worked with a Adult nurse.  I showed her some videos on YouTube for body weight training and yoga.  He is going to incorporate these into her physical fitness regimen.  Will have her follow-up in 2 months at which time I feel certain that she will have met the BMI of 40 goal.

## 2023-09-30 ENCOUNTER — Encounter: Payer: Self-pay | Admitting: Plastic Surgery

## 2023-09-30 ENCOUNTER — Ambulatory Visit (INDEPENDENT_AMBULATORY_CARE_PROVIDER_SITE_OTHER): Payer: No Typology Code available for payment source | Admitting: Plastic Surgery

## 2023-09-30 VITALS — BP 121/77 | HR 110 | Ht 61.0 in | Wt 218.8 lb

## 2023-09-30 DIAGNOSIS — Z713 Dietary counseling and surveillance: Secondary | ICD-10-CM | POA: Diagnosis not present

## 2023-09-30 DIAGNOSIS — Z6841 Body Mass Index (BMI) 40.0 and over, adult: Secondary | ICD-10-CM

## 2023-09-30 DIAGNOSIS — N62 Hypertrophy of breast: Secondary | ICD-10-CM

## 2023-09-30 NOTE — Progress Notes (Signed)
Megan Ball returns today for weight check.  She has lost weight since last time but BMI is still at 41.3.  She is continuing to work on her weight loss.  We discussed watching portions during Christmas and Thanksgiving.   She will return to see me in mid late January.

## 2023-11-18 ENCOUNTER — Encounter: Payer: Self-pay | Admitting: Plastic Surgery

## 2023-11-18 ENCOUNTER — Ambulatory Visit: Payer: No Typology Code available for payment source | Admitting: Plastic Surgery

## 2023-11-18 VITALS — BP 135/78 | HR 105 | Ht 61.0 in | Wt 221.8 lb

## 2023-11-18 DIAGNOSIS — Z6841 Body Mass Index (BMI) 40.0 and over, adult: Secondary | ICD-10-CM

## 2023-11-18 DIAGNOSIS — M542 Cervicalgia: Secondary | ICD-10-CM | POA: Diagnosis not present

## 2023-11-18 DIAGNOSIS — N62 Hypertrophy of breast: Secondary | ICD-10-CM

## 2023-11-18 DIAGNOSIS — M546 Pain in thoracic spine: Secondary | ICD-10-CM

## 2023-11-18 NOTE — Progress Notes (Signed)
 Megan Ball returns today for follow-up.  Patient has significant issues with upper back and neck pain which she attributes to the large size of her breast.  She has been working very diligently on weight loss.  When she was initially seen her BMI was around 50 she is down to around 42 at this time.  She did have a slight increase in weight since her last visit but she is continuing to work on weight loss.  I suspect that she will be able to meet the BMI of 40 criteria later this year.  We should be able to get her scheduled for breast reduction at that time.  She is going to schedule an appointment when her weight has dropped down to the appropriate level.
